# Patient Record
Sex: Female | Born: 1985 | Race: White | Hispanic: No | Marital: Single | Smoking: Current some day smoker
Health system: Southern US, Community
[De-identification: ages and names within clinical notes are randomized; demographics above are authoritative.]

## PROBLEM LIST (undated history)

## (undated) DIAGNOSIS — IMO0001 Reserved for inherently not codable concepts without codable children: Secondary | ICD-10-CM

## (undated) DIAGNOSIS — F329 Major depressive disorder, single episode, unspecified: Secondary | ICD-10-CM

## (undated) DIAGNOSIS — F1910 Other psychoactive substance abuse, uncomplicated: Secondary | ICD-10-CM

## (undated) DIAGNOSIS — F32A Depression, unspecified: Secondary | ICD-10-CM

## (undated) DIAGNOSIS — F1020 Alcohol dependence, uncomplicated: Secondary | ICD-10-CM

## (undated) HISTORY — DX: Depression, unspecified: F32.A

## (undated) HISTORY — DX: Major depressive disorder, single episode, unspecified: F32.9

---

## 2006-11-14 ENCOUNTER — Ambulatory Visit: Payer: Self-pay

## 2006-11-21 ENCOUNTER — Ambulatory Visit: Payer: Self-pay

## 2006-11-28 ENCOUNTER — Ambulatory Visit: Payer: Self-pay

## 2006-12-06 ENCOUNTER — Ambulatory Visit: Payer: Self-pay

## 2006-12-19 ENCOUNTER — Ambulatory Visit: Payer: Self-pay

## 2007-08-22 ENCOUNTER — Ambulatory Visit: Payer: Self-pay

## 2009-01-18 ENCOUNTER — Emergency Department (HOSPITAL_COMMUNITY): Admission: EM | Admit: 2009-01-18 | Discharge: 2009-01-19 | Payer: Self-pay | Admitting: Emergency Medicine

## 2009-09-17 ENCOUNTER — Ambulatory Visit: Payer: Self-pay | Admitting: Oncology

## 2009-09-28 LAB — CBC & DIFF AND RETIC
BASO%: 1 % (ref 0.0–2.0)
HCT: 37.5 % (ref 34.8–46.6)
LYMPH%: 23 % (ref 14.0–49.7)
MCHC: 34.4 g/dL (ref 31.5–36.0)
MCV: 103.3 fL — ABNORMAL HIGH (ref 79.5–101.0)
MONO#: 0.4 10*3/uL (ref 0.1–0.9)
MONO%: 12.1 % (ref 0.0–14.0)
NEUT%: 60.6 % (ref 38.4–76.8)
Platelets: 130 10*3/uL — ABNORMAL LOW (ref 145–400)
WBC: 3.1 10*3/uL — ABNORMAL LOW (ref 3.9–10.3)

## 2009-09-28 LAB — MORPHOLOGY: PLT EST: DECREASED

## 2009-10-01 LAB — LACTATE DEHYDROGENASE: LDH: 212 U/L (ref 94–250)

## 2009-10-01 LAB — COMPREHENSIVE METABOLIC PANEL
Albumin: 5 g/dL (ref 3.5–5.2)
BUN: 7 mg/dL (ref 6–23)
CO2: 24 mEq/L (ref 19–32)
Glucose, Bld: 94 mg/dL (ref 70–99)
Potassium: 4.2 mEq/L (ref 3.5–5.3)
Sodium: 136 mEq/L (ref 135–145)
Total Bilirubin: 0.9 mg/dL (ref 0.3–1.2)
Total Protein: 7.5 g/dL (ref 6.0–8.3)

## 2009-10-01 LAB — GAMMA GT: GGT: 196 U/L — ABNORMAL HIGH (ref 7–51)

## 2009-10-28 ENCOUNTER — Ambulatory Visit: Payer: Self-pay | Admitting: Oncology

## 2010-03-11 ENCOUNTER — Emergency Department (HOSPITAL_COMMUNITY)
Admission: EM | Admit: 2010-03-11 | Discharge: 2010-03-12 | Disposition: A | Discharge status: Home / Self Care | Payer: BC Managed Care – PPO | Attending: Emergency Medicine | Admitting: Emergency Medicine

## 2010-03-11 DIAGNOSIS — D72810 Lymphocytopenia: Secondary | ICD-10-CM | POA: Insufficient documentation

## 2010-03-11 DIAGNOSIS — F10939 Alcohol use, unspecified with withdrawal, unspecified: Secondary | ICD-10-CM | POA: Insufficient documentation

## 2010-03-11 DIAGNOSIS — R000 Tachycardia, unspecified: Secondary | ICD-10-CM | POA: Insufficient documentation

## 2010-03-11 DIAGNOSIS — F10239 Alcohol dependence with withdrawal, unspecified: Secondary | ICD-10-CM | POA: Insufficient documentation

## 2010-03-11 DIAGNOSIS — D696 Thrombocytopenia, unspecified: Secondary | ICD-10-CM | POA: Insufficient documentation

## 2010-03-11 DIAGNOSIS — F3289 Other specified depressive episodes: Secondary | ICD-10-CM | POA: Insufficient documentation

## 2010-03-11 DIAGNOSIS — F411 Generalized anxiety disorder: Secondary | ICD-10-CM | POA: Insufficient documentation

## 2010-03-11 DIAGNOSIS — F329 Major depressive disorder, single episode, unspecified: Secondary | ICD-10-CM | POA: Insufficient documentation

## 2010-03-12 LAB — RAPID URINE DRUG SCREEN, HOSP PERFORMED
Amphetamines: NOT DETECTED
Barbiturates: NOT DETECTED
Benzodiazepines: NOT DETECTED
Cocaine: NOT DETECTED

## 2010-03-12 LAB — DIFFERENTIAL
Basophils Relative: 1 % (ref 0–1)
Lymphocytes Relative: 9 % — ABNORMAL LOW (ref 12–46)
Monocytes Absolute: 0.1 10*3/uL (ref 0.1–1.0)
Monocytes Relative: 4 % (ref 3–12)
Neutro Abs: 2.3 10*3/uL (ref 1.7–7.7)
Neutrophils Relative %: 86 % — ABNORMAL HIGH (ref 43–77)

## 2010-03-12 LAB — CBC
HCT: 37.8 % (ref 36.0–46.0)
Hemoglobin: 12.6 g/dL (ref 12.0–15.0)
MCH: 35.2 pg — ABNORMAL HIGH (ref 26.0–34.0)
RBC: 3.58 MIL/uL — ABNORMAL LOW (ref 3.87–5.11)

## 2010-03-12 LAB — POCT I-STAT, CHEM 8
Creatinine, Ser: 0.9 mg/dL (ref 0.4–1.2)
Glucose, Bld: 78 mg/dL (ref 70–99)
Hemoglobin: 13.6 g/dL (ref 12.0–15.0)
TCO2: 11 mmol/L (ref 0–100)

## 2010-03-12 LAB — ETHANOL: Alcohol, Ethyl (B): 231 mg/dL — ABNORMAL HIGH (ref 0–10)

## 2010-03-22 LAB — CBC
MCHC: 34.3 g/dL (ref 30.0–36.0)
MCV: 101.3 fL — ABNORMAL HIGH (ref 78.0–100.0)
Platelets: 111 10*3/uL — ABNORMAL LOW (ref 150–400)
RDW: 13.1 % (ref 11.5–15.5)

## 2010-03-22 LAB — BASIC METABOLIC PANEL
BUN: 9 mg/dL (ref 6–23)
CO2: 25 mEq/L (ref 19–32)
Calcium: 9.5 mg/dL (ref 8.4–10.5)
Chloride: 97 mEq/L (ref 96–112)
Creatinine, Ser: 0.74 mg/dL (ref 0.4–1.2)

## 2010-03-22 LAB — DIFFERENTIAL
Basophils Absolute: 0 10*3/uL (ref 0.0–0.1)
Basophils Relative: 1 % (ref 0–1)
Eosinophils Absolute: 0 10*3/uL (ref 0.0–0.7)
Neutro Abs: 4.4 10*3/uL (ref 1.7–7.7)
Neutrophils Relative %: 85 % — ABNORMAL HIGH (ref 43–77)

## 2010-03-22 LAB — LIPASE, BLOOD: Lipase: 31 U/L (ref 11–59)

## 2011-02-04 ENCOUNTER — Ambulatory Visit (INDEPENDENT_AMBULATORY_CARE_PROVIDER_SITE_OTHER): Payer: BC Managed Care – PPO | Admitting: Internal Medicine

## 2011-02-04 VITALS — BP 123/74 | HR 73 | Temp 99.4°F | Resp 16 | Ht 65.5 in | Wt 118.0 lb

## 2011-02-04 DIAGNOSIS — F909 Attention-deficit hyperactivity disorder, unspecified type: Secondary | ICD-10-CM

## 2011-02-04 DIAGNOSIS — F411 Generalized anxiety disorder: Secondary | ICD-10-CM

## 2011-02-04 DIAGNOSIS — F1910 Other psychoactive substance abuse, uncomplicated: Secondary | ICD-10-CM

## 2011-02-04 DIAGNOSIS — F1010 Alcohol abuse, uncomplicated: Secondary | ICD-10-CM | POA: Insufficient documentation

## 2011-02-04 DIAGNOSIS — G4700 Insomnia, unspecified: Secondary | ICD-10-CM | POA: Insufficient documentation

## 2011-02-04 NOTE — Progress Notes (Signed)
Joua returns after her her last 3 months on Wellbutrin. She has continued to be up and down with some alcohol use. She is still in the application process for graduate school at [employer]. Her main problem today is that she can't sleep. This is a long-standing problem dating to childhood in no medicine is found to be very effective. She is currently not in therapy being dissatisfied with everything that happened.  Review of systems is unchanged  Physical examination is not necessary for this visit  Her neuropsychiatric  status is stable.   Problem #1 generalized anxiety disorder  Problem #2 ADD-off medication  Problem #3 PTSD  Problem #4 insomnia  Problem #5 depression  Problem #6  Alcohol abuse  Plan-1 continued Wellbutrin6 at 300 XL  2 add Halcion 0.25 at bedtime as a trial. If not effective she will try 25-50 mg and Seroquel. She will call with the results for a prescription.

## 2011-03-01 ENCOUNTER — Ambulatory Visit (INDEPENDENT_AMBULATORY_CARE_PROVIDER_SITE_OTHER): Payer: BC Managed Care – PPO | Admitting: Internal Medicine

## 2011-03-01 DIAGNOSIS — G4700 Insomnia, unspecified: Secondary | ICD-10-CM

## 2011-03-01 DIAGNOSIS — F1910 Other psychoactive substance abuse, uncomplicated: Secondary | ICD-10-CM

## 2011-03-01 DIAGNOSIS — F411 Generalized anxiety disorder: Secondary | ICD-10-CM

## 2011-03-01 MED ORDER — TRAZODONE HCL 100 MG PO TABS: 100.0000 mg | ORAL_TABLET | Freq: Every day | ORAL | Status: DC

## 2011-03-01 NOTE — Progress Notes (Signed)
  Subjective:    Patient ID: Alexandra Osborne, female    DOB: 1985/03/03, 26 y.o.   MRN: [id number]  HPIFollowup for problems with anxiety and insomnia and the related alcohol abuse. She continues with sundown since has decided to commit to a treatment protocol with vivitrol which will be directed by Dr. Delano Metz. Anxiety continues to be fairly prevalent but low level at this point. She continues on Wellbutrin She has had increased problems with insomnia. Note that this has been a problem for many years. Her last successful medication was trazodone at 100 mg and she would like to try that again.  She has been accepted into the Spanish-language graduate program at [employer] and given a two thirds scholarship. 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 She continues to be reminded of problem behaviors and the consequences that others had to suffer. She is having a hard for getting herself for this behavior and a hard time getting past herself    Review of SystemsNoncontributory     Objective:   Physical Exam   Vital signs stable Neurological intact      Assessment & Plan:  Problem #1 insomnia Trazodone 100 mg #30 with 5 refills  Problem #2 alcohol abuse I discussed with Dr. Salvadore Farber arranging for her to [facility] [place] office to start vivitrol which she has arranged with her insurance company  Problem #3 depression and anxiety/generalized anxiety disorder /Continue Wellbutrin she may call for refills  Problem #4 ADD-when she starts school in the fall we will restart her Adderall

## 2011-04-02 ENCOUNTER — Emergency Department (HOSPITAL_COMMUNITY): Payer: BC Managed Care – PPO

## 2011-04-02 ENCOUNTER — Encounter (HOSPITAL_COMMUNITY): Payer: Self-pay | Admitting: Emergency Medicine

## 2011-04-02 ENCOUNTER — Emergency Department (HOSPITAL_COMMUNITY)
Admission: EM | Admit: 2011-04-02 | Discharge: 2011-04-03 | Disposition: A | Discharge status: Home / Self Care | Payer: BC Managed Care – PPO | Attending: Emergency Medicine | Admitting: Emergency Medicine

## 2011-04-02 DIAGNOSIS — S0180XA Unspecified open wound of other part of head, initial encounter: Secondary | ICD-10-CM | POA: Insufficient documentation

## 2011-04-02 DIAGNOSIS — F1010 Alcohol abuse, uncomplicated: Secondary | ICD-10-CM | POA: Insufficient documentation

## 2011-04-02 DIAGNOSIS — S0181XA Laceration without foreign body of other part of head, initial encounter: Secondary | ICD-10-CM

## 2011-04-02 DIAGNOSIS — F10929 Alcohol use, unspecified with intoxication, unspecified: Secondary | ICD-10-CM

## 2011-04-02 DIAGNOSIS — S02609A Fracture of mandible, unspecified, initial encounter for closed fracture: Secondary | ICD-10-CM

## 2011-04-02 DIAGNOSIS — W010XXA Fall on same level from slipping, tripping and stumbling without subsequent striking against object, initial encounter: Secondary | ICD-10-CM | POA: Insufficient documentation

## 2011-04-02 MED ORDER — LIDOCAINE-EPINEPHRINE 2 %-1:100000 IJ SOLN
20.0000 mL | Freq: Once | INTRAMUSCULAR | Status: AC
  Administered 2011-04-03: 20 mL via INTRADERMAL

## 2011-04-02 NOTE — ED Notes (Signed)
Patient transported to CT 

## 2011-04-02 NOTE — ED Notes (Addendum)
Pt fell and hit chin, pt does not remember incident d/t alcohol intoxication.  Approx 1" laceration noted under chin.  Bleeding controlled at this time, last Tetanus this year.  Pt also c/o pain to left side of face, no injuries noted, paint noted on left ear, pt unsure of how it got there.

## 2011-04-02 NOTE — ED Provider Notes (Signed)
History     CSN: [id number]  Arrival date & time 04/02/11  2057   First MD Initiated Contact with Patient 04/02/11 2141      Chief Complaint  Patient presents with  . Facial Laceration    (Consider location/radiation/quality/duration/timing/severity/associated sxs/prior treatment) HPI  26 year old female with a significant history of alcohol abuse presents with chief complaints of face laceration. She was obtained through patient and her family members.  Per pt, she admits to drinking alcohol today.  Sts she stumbled and fell forward hitting her face against a brick wall.  she denies loss of consciousness. She noticed pain to her chin and jaw worsening with talking. She denies neck pain, chest pain or shortness of breath, abdominal pain. She denies any other injuries. She is currently taking Viviltrol to help ease alcohol addiction.  She is receiving monthly shots but has missed her schedule shot for about a week.  Pt denies SI/HI, auditory or visual hallucination.  Denies any other rec drug use.  Tetanus is UTD.    Per parent, pt has been abusing alcohol for the past 5 years.  Alcohol abuse likely related to relationship stress. Patient has been to [facility] in the past. She is currently not attending any rehabilitation with exception of using Vivitrol.  Her PCP is aware of her alcohol addiction.    History reviewed. No pertinent past medical history.  History reviewed. No pertinent past surgical history.  History reviewed. No pertinent family history.  History  Substance Use Topics  . Smoking status: Current Some Day Smoker    Types: Cigarettes  . Smokeless tobacco: Not on file  . Alcohol Use: Yes    OB History    Grav Para Term Preterm Abortions TAB SAB Ect Mult Living                  Review of Systems  All other systems reviewed and are negative.    Allergies  Review of patient's allergies indicates no known allergies.  Home Medications   Current  Outpatient Rx  Name Route Sig Dispense Refill  . BUPROPION HCL ER (XL) 300 MG PO TB24 Oral Take 300 mg by mouth daily.    Kathrynn Running ESTRAD TRIPHASIC 0.5/0.75/1-35 MG-MCG PO TABS Oral Take 1 tablet by mouth daily.    . TRAZODONE HCL 100 MG PO TABS Oral Take 1 tablet (100 mg total) by mouth at bedtime. 30 tablet 2    BP 121/81  Pulse 98  Temp(Src) 97.8 F (36.6 C) (Oral)  Resp 18  SpO2 95%  LMP 03/21/2011  Physical Exam  Nursing note and vitals reviewed. Constitutional: She appears well-developed and well-nourished.       Awake, alert, nontoxic appearance.  Appears tearful, and slurring of words  HENT:  Head: Normocephalic.       2 cm horizontal laceration is of chin inferiorly. Mild pressure noted across bridge of nose. Trismus noted. no obvious dental pain. No hemotympanum. No septal hematoma. No significant midface tenderness.  Eyes: Conjunctivae are normal. Right eye exhibits no discharge. Left eye exhibits no discharge.  Neck: Neck supple.  Cardiovascular: Normal rate and regular rhythm.   Pulmonary/Chest: Effort normal. No respiratory distress. She exhibits no tenderness.  Abdominal: Soft. There is no tenderness. There is no rebound.  Musculoskeletal: Normal range of motion. She exhibits no tenderness.       ROM appears intact, no obvious focal weakness  Neurological:       Unsteady gait, poor coordination. Romberg  negative  Skin: No rash noted.  Psychiatric:       Appears intoxicated    ED Course  Procedures (including critical care time)  Labs Reviewed - No data to display No results found.   No diagnosis found.  LACERATION REPAIR Performed by: Fayrene Helper Authorized byFayrene Helper Consent: Verbal consent obtained. Risks and benefits: risks, benefits and alternatives were discussed Consent given by: patient Patient identity confirmed: provided demographic data Prepped and Draped in normal sterile fashion Wound explored  Laceration Location: inferior  aspect of chin  Laceration Length: 2cm  No Foreign Bodies seen or palpated  Anesthesia: local infiltration  Local anesthetic: lidocaine 2% w epinephrine  Anesthetic total: 3 ml  Irrigation method: syringe Amount of cleaning: standard  Skin closure: prolene 5.0  Number of sutures: 4  Technique: simple interrupted  Patient tolerance: Patient tolerated the procedure well with no immediate complications.  Results for orders placed during the hospital encounter of 03/11/10  POCT I-STAT, CHEM 8      Component Value Range   Sodium 136  135 - 145 (mEq/L)   Potassium 4.4  3.5 - 5.1 (mEq/L)   Chloride 107  96 - 112 (mEq/L)   BUN 6  6 - 23 (mg/dL)   Creatinine, Ser 0.9  0.4 - 1.2 (mg/dL)   Glucose, Bld 78  70 - 99 (mg/dL)   Calcium, Ion 1.61 (*) 1.12 - 1.32 (mmol/L)   TCO2 11  0 - 100 (mmol/L)   Hemoglobin 13.6  12.0 - 15.0 (g/dL)   HCT 09.6  04.5 - 40.9 (%)  DIFFERENTIAL      Component Value Range   Neutrophils Relative 86 (*) 43 - 77 (%)   Neutro Abs 2.3  1.7 - 7.7 (K/uL)   Lymphocytes Relative 9 (*) 12 - 46 (%)   Lymphs Abs 0.2 (*) 0.7 - 4.0 (K/uL)   Monocytes Relative 4  3 - 12 (%)   Monocytes Absolute 0.1  0.1 - 1.0 (K/uL)   Eosinophils Relative 0  0 - 5 (%)   Eosinophils Absolute 0.0  0.0 - 0.7 (K/uL)   Basophils Relative 1  0 - 1 (%)   Basophils Absolute 0.0  0.0 - 0.1 (K/uL)  CBC      Component Value Range   WBC 2.7 (*) 4.0 - 10.5 (K/uL)   RBC 3.58 (*) 3.87 - 5.11 (MIL/uL)   Hemoglobin 12.6  12.0 - 15.0 (g/dL)   HCT 81.1  91.4 - 78.2 (%)   MCV 105.6 (*) 78.0 - 100.0 (fL)   MCH 35.2 (*) 26.0 - 34.0 (pg)   MCHC 33.3  30.0 - 36.0 (g/dL)   RDW 95.6  21.3 - 08.6 (%)   Platelets 113 (*) 150 - 400 (K/uL)  ETHANOL      Component Value Range   Alcohol, Ethyl (B)   (*) 0 - 10 (mg/dL)   Value: 578            LOWEST DETECTABLE LIMIT FOR     SERUM ALCOHOL IS 5 mg/dL     FOR MEDICAL PURPOSES ONLY  DRUG SCREEN PANEL, EMERGENCY      Component Value Range   Opiates  NONE DETECTED  NONE DETECTED    Cocaine NONE DETECTED  NONE DETECTED    Benzodiazepines NONE DETECTED  NONE DETECTED    Amphetamines NONE DETECTED  NONE DETECTED    Tetrahydrocannabinol POSITIVE (*) NONE DETECTED    Barbiturates    NONE DETECTED    Value: NONE  DETECTED            DRUG SCREEN FOR MEDICAL PURPOSES     ONLY.  IF CONFIRMATION IS NEEDED     FOR ANY PURPOSE, NOTIFY LAB     WITHIN 5 DAYS.                LOWEST DETECTABLE LIMITS     FOR URINE DRUG SCREEN     Drug Class       Cutoff (ng/mL)     Amphetamine      1000     Barbiturate      200     Benzodiazepine   200     Tricyclics       300     Opiates          300     Cocaine          300     THC              50  PREGNANCY, URINE      Component Value Range   Preg Test, Ur       Value: NEGATIVE            THE SENSITIVITY OF THIS     METHODOLOGY IS >24 mIU/mL   Ct Maxillofacial Wo Cm  04/02/2011  *RADIOLOGY REPORT*  Clinical Data: Fall, hit chin.  Laceration.  CT MAXILLOFACIAL WITHOUT CONTRAST  Technique:  Multidetector CT imaging of the maxillofacial structures was performed. Multiplanar CT image reconstructions were also generated.  Comparison: None.  Findings: The study is severely limited by patient motion, despite repeating the study.  There is a fracture through the left mandibular head/neck.  It is difficult to exclude fracture elsewhere in the mandible due to extensive patient motion.  No other visible displaced fracture.  Paranasal sinuses are clear. Orbital soft tissues are unremarkable.  IMPRESSION: Fracture through the left mandibular head/neck region.  Mild displacement of the mandibular head.  The remainder of the mandible is difficult to completely clear due to extensive patient motion despite repeating the study.  Original Report Authenticated By: Cyndie Chime, M.D.      MDM  Patient has a significant history of alcohol abuse. Last alcohol use was today. Unable to quantify an amount. Had apparent fall with  a laceration to her chin. No other obvious injury. Maxillofacial CT order to rule out fractures. Plan to perform wound care to her laceration.  Patient's parent request for rehabs. However, patient is not willing to participate today. She denies homicidal or suicidal ideation. Parent appears to be caring.  12:17 AM Successful sutures to chin laceration. This maxillofacial CT shows evidence of a left mandibular head fracture. Image was poorly performed due to patient's moving. However no other obvious fractures noted. I discussed with my attending who has consult with Dr. Lazarus Salines, who agrees to see pt in office next week for further evaluation.  Recommend soft diet and pain medication.  Pt also instruct to have suture removed in 7 days.  Alcohol cessation discussed.  Resource given.  Parent will take pt home and ensure safety.        Fayrene Helper, PA-C 04/03/11 [phone]

## 2011-04-03 MED ORDER — HYDROCODONE-ACETAMINOPHEN 5-500 MG PO TABS: 1.0000 | ORAL_TABLET | Freq: Four times a day (QID) | ORAL | Status: AC | PRN

## 2011-04-03 NOTE — Discharge Instructions (Signed)
You have a left mandibular head fracture.  Please call Dr. Lazarus Salines for follow up at your earliest convenient.  Your chin laceration were sutured.  Suture will need to be removed in 5-7 days.  Please eat soft diet, take pain medication as directed.  Alcohol cessation can be achieve with help.  Use resources given.  Return if you have any other concerns.   Finding Treatment for Alcohol and Drug Addiction It can be hard to find the right place to get professional treatment. Here are some important things to consider:  There are different types of treatment to choose from.   Some programs are live-in (residential) while others are not (outpatient). Sometimes a combination is offered.   No single type of program is right for everyone.   Most treatment programs involve a combination of education, counseling, and a 12-step, spiritually-based approach.   There are non-spiritually based programs (not 12-step).   Some treatment programs are government sponsored. They are geared for patients without private insurance.   Treatment programs can vary in many respects such as:   Cost and types of insurance accepted.   Types of on-site medical services offered.   Length of stay, setting, and size.   Overall philosophy of treatment.  A person may need specialized treatment or have needs not addressed by all programs. For example, adolescents need treatment appropriate for their age. Other people have secondary disorders that must be managed as well. Secondary conditions can include mental illness, such as depression or diabetes. Often, a period of detoxification from alcohol or drugs is needed. This requires medical supervision and not all programs offer this. THINGS TO CONSIDER WHEN SELECTING A TREATMENT PROGRAM   Is the program certified by the appropriate government agency? Even private programs must be certified and employ certified professionals.   Does the program accept your insurance? If not,  can a payment plan be set up?   Is the facility clean, organized, and well run? Do they allow you to speak with graduates who can share their treatment experience with you? Can you tour the facility? Can you meet with staff?   Does the program meet the full range of individual needs?   Does the treatment program address sexual orientation and physical disabilities? Do they provide age, gender, and culturally appropriate treatment services?   Is treatment available in languages other than English?   Is long-term aftercare support or guidance encouraged and provided?   Is assessment of an individual's treatment plan ongoing to ensure it meets changing needs?   Does the program use strategies to encourage reluctant patients to remain in treatment long enough to increase the likelihood of success?   Does the program offer counseling (individual or group) and other behavioral therapies?   Does the program offer medicine as part of the treatment regimen, if needed?   Is there ongoing monitoring of possible relapse? Is there a defined relapse prevention program? Are services or referrals offered to family members to ensure they understand addiction and the recovery process? This would help them support the recovering individual.   Are 12-step meetings held at the center or is transport available for patients to attend outside meetings?  In countries outside of the [place]. and [place], Magazine features editor for contact information for services in your area. Document Released: 11/18/2004 Document Revised: 12/09/2010 Document Reviewed: 05/31/2007 [facility] Patient Information 2012 [place], [place].Facial Fracture A facial fracture is a break in one of the bones of your face. HOME CARE INSTRUCTIONS  Protect the injured part of your face until it is healed.   Do not participate in activities which give chance for re-injury until your doctor approves.   Gently wash and dry your face.   Wear head and  facial protection while riding a bicycle, motorcycle, or snowmobile.  SEEK MEDICAL CARE IF:   An oral temperature above 102 F (38.9 C) develops.   You have severe headaches or notice changes in your vision.   You have new numbness or tingling in your face.   You develop nausea (feeling sick to your stomach), vomiting or a stiff neck.  SEEK IMMEDIATE MEDICAL CARE IF:   You develop difficulty seeing or experience double vision.   You become dizzy, lightheaded, or faint.   You develop trouble speaking, breathing, or swallowing.   You have a watery discharge from your nose or ear.  MAKE SURE YOU:   Understand these instructions.   Will watch your condition.   Will get help right away if you are not doing well or get worse.  Document Released: 12/20/2004 Document Revised: 12/09/2010 Document Reviewed: 08/09/2007 [facility] Patient Information 2012 [place], [place].Facial Laceration A facial laceration is a cut on the face. It can take 1 to 2 years for the scar to heal completely. HOME CARE  For stitches (sutures):  Keep the cut clean and dry.   If you have a bandage (dressing), change it at least once a day. Change the bandage if it gets wet or dirty, or as told by your doctor.   Wash the cut with soap and water 2 times a day. Rinse the cut with water. Pat it dry with a clean towel.   Put a thin layer of medicated cream on the cut as told by your doctor.   You may shower after the first 24 hours. Do not soak the cut in water until the stitches are removed.   Only take medicines as told by your doctor.   Have your stitches removed as told by your doctor.   Do not wear makeup until a few days after your stitches are removed.  For skin adhesive strips:  Keep the cut clean and dry.   Do not get the strips wet. You may take a bath, but be careful to keep the cut dry.   If the cut gets wet, pat it dry with a clean towel.   The strips will fall off on their own. Do not  remove the strips that are still stuck to the cut.  For wound glue:  You may shower or take baths. Do not soak or scrub the cut. Do not swim. Avoid heavy sweating until the glue falls off on its own. After a shower or bath, pat the cut dry with a clean towel.   Do not put medicine or makeup on your cut until the glue falls off.   If you have a bandage, do not put tape over the glue.   Avoid lots of sunlight or tanning lamps until the glue falls off. Put sunscreen on the cut for the first year to reduce your scar.   The glue will fall off on its own. Do not pick at the glue.  You may need a tetanus shot if:  You cannot remember when you had your last tetanus shot.   You have never had a tetanus shot.  If you need a tetanus shot and you choose not to have one, you may get tetanus. Sickness from tetanus can  be serious. GET HELP RIGHT AWAY IF:   Your cut gets red, painful, or puffy (swollen).   There is yellowish-white fluid (pus) coming from the cut.   You have chills or a fever.  MAKE SURE YOU:   Understand these instructions.   Will watch your condition.   Will get help right away if you are not doing well or get worse.  Document Released: 06/08/2007 Document Revised: 12/09/2010 Document Reviewed: 06/15/2010 [facility] Patient Information 2012 [place], [place].   RESOURCE GUIDE  Dental Problems  Patients with Medicaid: [facility]                     [phone] [address]                                           Phone:  [phone]                                                  If unable to pay or uninsured, contact:  [facility] or [facility]. to become qualified for the adult dental clinic.  Chronic Pain Problems Contact [facility]  [phone] Patients need to be referred by their primary care doctor.  Insufficient Money for Medicine Contact United Way:  call "211" or [facility] (916)089-8859.  No Primary  Care Doctor Call [facility]  548-654-1915 Other agencies that provide inexpensive medical care    [facility]  7693469713    [facility]  [phone]    [facility]  [phone]    [facility]  [phone]    [facility]  [phone]    [facility]  [phone]  Substance Abuse Resources [facility]  646-774-1287 [facility] 201-491-6882 [facility] [place] (626)646-4351 Floydene Flock 872-735-2887 Residential & Outpatient Substance Abuse Program  ([phone]  Psychological Services [facility]  ([phone] [facility]  [phone] [facility]   [phone] (emergency services 450-688-0250)  Abuse/Neglect [facility] Child Abuse Hotline 680 604 4518 [facility] Child Abuse Hotline (808)494-7162 (After Hours)  Emergency Shelter [facility] Ministries 639-329-7092  [facility] [place] [facility] [phone] [facility] Alert Services (715)[phone]  MRSA Hotline #:   214-221-5299    [facility] Resources  [facility]  United Way                           [facility] Dept. [address] [place]                     [address]         [address]  Patrecia Pace  [facility] Phone:  [phone]                                  Phone:  [phone]                   Phone:  [phone]  [facility] Phone:  [phone]  [facility] Child Abuse Hotline [phone] (After Hours)

## 2011-04-03 NOTE — ED Provider Notes (Signed)
Discussed with Dr Lazarus Salines.  The images are suboptimal however of the fracture depicted can be treated with a soft diet and pain medications. He would like to see the patient in followup in his office this week.  He plans on repeat images at that time.  Celene Kras, MD 04/03/11 [phone]

## 2011-08-24 ENCOUNTER — Other Ambulatory Visit: Payer: Self-pay | Admitting: Internal Medicine

## 2011-08-24 ENCOUNTER — Telehealth: Payer: Self-pay

## 2011-08-24 MED ORDER — AMPHETAMINE-DEXTROAMPHET ER 15 MG PO CP24: 15.0000 mg | ORAL_CAPSULE | ORAL | Status: DC

## 2011-08-24 MED ORDER — TRAZODONE HCL 100 MG PO TABS: 100.0000 mg | ORAL_TABLET | Freq: Every day | ORAL | Status: DC

## 2011-08-24 NOTE — Telephone Encounter (Signed)
Done and printed

## 2011-08-24 NOTE — Telephone Encounter (Signed)
OK X 1, NEEDS OV FOR MORE? 

## 2011-08-24 NOTE — Telephone Encounter (Signed)
Patient advised is at front desk for pick up

## 2011-08-24 NOTE — Telephone Encounter (Signed)
Chart pulled MR [id number]

## 2011-08-24 NOTE — Telephone Encounter (Signed)
PT STATES SHE IS IN NEED OF HER ADDERALL AND TRAZODONE. HER MOM WILL COME AND PICK UP PLEASE CALL [phone] WHEN READY

## 2011-08-24 NOTE — Telephone Encounter (Signed)
Please pull paper chart. Need Adderall dose.

## 2011-09-29 ENCOUNTER — Telehealth: Payer: Self-pay

## 2011-09-29 NOTE — Telephone Encounter (Signed)
Patient was prescribed 15 mg adderall.   She takes 5 mg up to three times a day.  She needs to discuss other things with Dr. Merla Riches and would like an URGENT call to her.   [phone]

## 2011-09-29 NOTE — Telephone Encounter (Signed)
Let her know I'm out-of-town until Monday She is struggling in graduate school at [employer]-Needs all the help she can get

## 2011-09-29 NOTE — Telephone Encounter (Signed)
Patient states her med should be 5 mg tid, however reviewing her chart she has not been given the 5mg  tid dose since 2011. She has been taking 15 mg XR. She wants you to advise. Chart in your box . Alexandra Osborne

## 2011-09-29 NOTE — Telephone Encounter (Signed)
2 continue that last message 5 mg 3 times a day when necessary we give her the greatest flexibility for her studies and that would be okay for me #90 no refills and we can talk again when needed

## 2011-09-30 ENCOUNTER — Other Ambulatory Visit: Payer: Self-pay | Admitting: Physician Assistant

## 2011-09-30 MED ORDER — AMPHETAMINE-DEXTROAMPHET ER 5 MG PO CP24: 5.0000 mg | ORAL_CAPSULE | Freq: Three times a day (TID) | ORAL | Status: DC

## 2011-09-30 MED ORDER — AMPHETAMINE-DEXTROAMPHETAMINE 5 MG PO TABS: 5.0000 mg | ORAL_TABLET | Freq: Three times a day (TID) | ORAL | Status: DC

## 2011-09-30 NOTE — Telephone Encounter (Signed)
Tried to call. Still no ans/VM full.

## 2011-09-30 NOTE — Telephone Encounter (Signed)
Rx printed. Called patient to advise but her voice mail box is full, if she calls back, will advise her Rx is at front desk for pick up

## 2011-10-03 NOTE — Telephone Encounter (Signed)
Notified pt that Adderall Rx is ready for p/up. Pt asked if Dr Merla Riches could Rx for Xanax for her because she has been having panic attacks. States they started about a month ago and she probably has them about once daily. Pt reports that she went to the hosp w/one recently. Pt states that Dr Merla Riches used to Rx xanax for pt's anxiety and she requests that he send in a new Rx to [facility] if possibly. Pt's paper chart pulled and put in Dr Doolittle's box [id number].

## 2011-10-04 MED ORDER — ALPRAZOLAM 0.5 MG PO TABS: ORAL_TABLET | ORAL | Status: DC

## 2011-10-04 NOTE — Telephone Encounter (Signed)
I sent in the Rx to [facility] with a note for them to have patient call me back so I can speak to her, the mailbox for her VM is full

## 2011-10-04 NOTE — Telephone Encounter (Signed)
Discussed case with mother/Alexandra Osborne is now at [employer] in graduate school in Spanish/struggling with the curriculum/his head only very occasional relapses with alcoholism since her treatment as inpatient this summer She is pursuing counseling at the [facility] Her mailboxes full We will allow a small amount of medication that she needs to followup in the next 2 or 3 weeks probably on the weekend Meds ordered this encounter  Medications                 . ALPRAZolam (XANAX) 0.5 MG tablet    Sig: One as needed for panic    Dispense:  10 tablet    Refill:  0

## 2011-10-05 ENCOUNTER — Telehealth: Payer: Self-pay

## 2011-10-05 NOTE — Telephone Encounter (Signed)
Pt is requesting xanax   cbn [phone]

## 2011-10-05 NOTE — Telephone Encounter (Signed)
Pt notified that rx was at pharmacy 

## 2011-10-10 NOTE — Telephone Encounter (Signed)
I am still unable to reach patient, her VM is full, I have tried several times. I have had the pharmacy advise her to call so I can advise she needs appt with Dr Merla Riches.

## 2011-10-11 NOTE — Progress Notes (Signed)
Received an approval for prior auth for pt's Adderall 5 mg TID. Faxed approval notice to pharmacy.

## 2011-10-11 NOTE — Telephone Encounter (Signed)
Patient does not return my calls. FYI, multiple attempts.

## 2011-10-25 NOTE — Telephone Encounter (Signed)
Needs appt 10/23 9:15am 104

## 2011-10-25 NOTE — Telephone Encounter (Signed)
Appt made, pt mother notified by Dr. Merla Riches. Alexandra Osborne

## 2011-10-26 ENCOUNTER — Ambulatory Visit: Payer: BC Managed Care – PPO | Admitting: Internal Medicine

## 2011-10-31 ENCOUNTER — Encounter (HOSPITAL_COMMUNITY): Payer: Self-pay | Admitting: Emergency Medicine

## 2011-10-31 ENCOUNTER — Emergency Department (HOSPITAL_COMMUNITY)
Admission: EM | Admit: 2011-10-31 | Discharge: 2011-11-01 | Disposition: A | Discharge status: Home / Self Care | Payer: BC Managed Care – PPO | Attending: Emergency Medicine | Admitting: Emergency Medicine

## 2011-10-31 ENCOUNTER — Emergency Department (HOSPITAL_COMMUNITY): Payer: BC Managed Care – PPO

## 2011-10-31 ENCOUNTER — Ambulatory Visit: Payer: BC Managed Care – PPO | Admitting: Internal Medicine

## 2011-10-31 VITALS — BP 120/70 | HR 111 | Temp 98.0°F | Resp 16 | Ht 66.0 in | Wt 107.0 lb

## 2011-10-31 DIAGNOSIS — R45851 Suicidal ideations: Secondary | ICD-10-CM

## 2011-10-31 DIAGNOSIS — F329 Major depressive disorder, single episode, unspecified: Secondary | ICD-10-CM | POA: Insufficient documentation

## 2011-10-31 DIAGNOSIS — D696 Thrombocytopenia, unspecified: Secondary | ICD-10-CM

## 2011-10-31 DIAGNOSIS — F1010 Alcohol abuse, uncomplicated: Secondary | ICD-10-CM | POA: Insufficient documentation

## 2011-10-31 DIAGNOSIS — R112 Nausea with vomiting, unspecified: Secondary | ICD-10-CM | POA: Insufficient documentation

## 2011-10-31 DIAGNOSIS — F10239 Alcohol dependence with withdrawal, unspecified: Secondary | ICD-10-CM

## 2011-10-31 DIAGNOSIS — F3289 Other specified depressive episodes: Secondary | ICD-10-CM | POA: Insufficient documentation

## 2011-10-31 DIAGNOSIS — F17200 Nicotine dependence, unspecified, uncomplicated: Secondary | ICD-10-CM | POA: Insufficient documentation

## 2011-10-31 DIAGNOSIS — S0990XA Unspecified injury of head, initial encounter: Secondary | ICD-10-CM

## 2011-10-31 LAB — POCT CBC
HCT, POC: 42 % (ref 37.7–47.9)
Hemoglobin: 13.2 g/dL (ref 12.2–16.2)
MCH, POC: 33 pg — AB (ref 27–31.2)
MCV: 105.1 fL — AB (ref 80–97)
MPV: 9 fL (ref 0–99.8)
POC MID %: 7.8 %M (ref 0–12)
RBC: 4 M/uL — AB (ref 4.04–5.48)
WBC: 5.1 10*3/uL (ref 4.6–10.2)

## 2011-10-31 LAB — COMPREHENSIVE METABOLIC PANEL
ALT: 44 U/L — ABNORMAL HIGH (ref 0–35)
Albumin: 4 g/dL (ref 3.5–5.2)
Alkaline Phosphatase: 92 U/L (ref 39–117)
Glucose, Bld: 81 mg/dL (ref 70–99)
Potassium: 3.6 mEq/L (ref 3.5–5.1)
Sodium: 137 mEq/L (ref 135–145)
Total Protein: 6.5 g/dL (ref 6.0–8.3)

## 2011-10-31 LAB — URINALYSIS, ROUTINE W REFLEX MICROSCOPIC
Bilirubin Urine: NEGATIVE
Nitrite: NEGATIVE
Protein, ur: NEGATIVE mg/dL
Urobilinogen, UA: 1 mg/dL (ref 0.0–1.0)

## 2011-10-31 LAB — RAPID URINE DRUG SCREEN, HOSP PERFORMED
Amphetamines: NOT DETECTED
Barbiturates: NOT DETECTED
Benzodiazepines: NOT DETECTED

## 2011-10-31 LAB — CBC
Hemoglobin: 12.4 g/dL (ref 12.0–15.0)
MCHC: 35.8 g/dL (ref 30.0–36.0)
RDW: 13.4 % (ref 11.5–15.5)

## 2011-10-31 MED ORDER — IBUPROFEN 800 MG PO TABS
800.0000 mg | ORAL_TABLET | Freq: Once | ORAL | Status: AC
  Administered 2011-10-31: 800 mg via ORAL
  Filled 2011-10-31: qty 1

## 2011-10-31 MED ORDER — LORAZEPAM 2 MG/ML IJ SOLN
0.0000 mg | Freq: Four times a day (QID) | INTRAMUSCULAR | Status: DC
  Administered 2011-10-31: 1 mg via INTRAVENOUS
  Filled 2011-10-31: qty 1

## 2011-10-31 MED ORDER — LORAZEPAM 2 MG/ML IJ SOLN: 0.0000 mg | Freq: Two times a day (BID) | INTRAMUSCULAR | Status: DC

## 2011-10-31 MED ORDER — ADULT MULTIVITAMIN W/MINERALS CH
1.0000 | ORAL_TABLET | Freq: Every day | ORAL | Status: DC
  Administered 2011-10-31: 1 via ORAL
  Filled 2011-10-31: qty 1

## 2011-10-31 MED ORDER — THIAMINE HCL 100 MG/ML IJ SOLN: 100.0000 mg | Freq: Every day | INTRAMUSCULAR | Status: DC

## 2011-10-31 MED ORDER — FOLIC ACID 1 MG PO TABS
1.0000 mg | ORAL_TABLET | Freq: Every day | ORAL | Status: DC
  Administered 2011-10-31: 1 mg via ORAL
  Filled 2011-10-31: qty 1

## 2011-10-31 MED ORDER — SODIUM CHLORIDE 0.9 % IV BOLUS (SEPSIS)
1000.0000 mL | Freq: Once | INTRAVENOUS | Status: AC
  Administered 2011-10-31: 1000 mL via INTRAVENOUS

## 2011-10-31 MED ORDER — VITAMIN B-1 100 MG PO TABS
100.0000 mg | ORAL_TABLET | Freq: Every day | ORAL | Status: DC
  Administered 2011-10-31: 100 mg via ORAL
  Filled 2011-10-31: qty 1

## 2011-10-31 MED ORDER — ONDANSETRON HCL 4 MG/2ML IJ SOLN
4.0000 mg | Freq: Once | INTRAMUSCULAR | Status: AC
  Administered 2011-10-31: 4 mg via INTRAVENOUS
  Filled 2011-10-31: qty 2

## 2011-10-31 NOTE — ED Notes (Signed)
Pt states she is here to detox from alcohol  Pt states she fell last night and pt has a black eye on the right from the fall   Pt was seen by Dr Merla Riches then sent here  Pt received 2 liters of NS at the drs office and had a CBC there  Results in the chart  Pt has had nausea and vomiting since this am

## 2011-10-31 NOTE — ED Provider Notes (Signed)
History     CSN: [id number]  Arrival date & time 10/31/11  2002   First MD Initiated Contact with Patient 10/31/11 2027      Chief Complaint  Patient presents with  . detox     (Consider location/radiation/quality/duration/timing/severity/associated sxs/prior treatment) The history is provided by the patient.   patient is requesting detox off of alcohol. She's previously had to be treated inpatient. She was sober for 6 weeks. She drinks every day. She's had falls and doesn't know what happened. She has a black eye on the right side now. She will drink up to a fifth of liquor a day. She last drank last night. She's had some vomiting. No confusion. She's had some anxiety and depression. She was sent in by her primary care doctor's office. No fevers. She's had an occasional cough. No abdominal pain.  Past Medical History  Diagnosis Date  . Depression     History reviewed. No pertinent past surgical history.  Family History  Problem Relation Age of Onset  . Stroke Maternal Grandfather   . Cancer Maternal Grandmother   . Diabetes Maternal Grandmother   . Stroke Other   . Cancer Other   . Hypertension Other     History  Substance Use Topics  . Smoking status: Current Some Day Smoker    Types: Cigarettes  . Smokeless tobacco: Not on file  . Alcohol Use: 0.0 oz/week     substance (alcohol) abuse    OB History    Grav Para Term Preterm Abortions TAB SAB Ect Mult Living                  Review of Systems  Constitutional: Negative for activity change and appetite change.  HENT: Negative for neck stiffness.   Eyes: Negative for pain.  Respiratory: Negative for chest tightness and shortness of breath.   Cardiovascular: Negative for chest pain and leg swelling.  Gastrointestinal: Positive for nausea and vomiting. Negative for abdominal pain and diarrhea.  Genitourinary: Negative for flank pain.  Musculoskeletal: Negative for back pain.  Skin: Negative for rash.    Neurological: Negative for weakness, numbness and headaches.  Psychiatric/Behavioral: Positive for dysphoric mood. Negative for behavioral problems. The patient is nervous/anxious.     Allergies  Review of patient's allergies indicates no known allergies.  Home Medications   Current Outpatient Rx  Name Route Sig Dispense Refill  . ALPRAZOLAM 0.5 MG PO TABS Oral Take 0.5 mg by mouth 3 (three) times daily as needed. One as needed for panic    . AMPHETAMINE-DEXTROAMPHETAMINE 5 MG PO TABS Oral Take 5 mg by mouth 3 (three) times daily.    . BUPROPION HCL ER (XL) 300 MG PO TB24 Oral Take 300 mg by mouth daily.    Kathrynn Running ESTRAD TRIPHASIC 0.5/0.75/1-35 MG-MCG PO TABS Oral Take 1 tablet by mouth daily.    . TRAZODONE HCL 100 MG PO TABS Oral Take 1 tablet (100 mg total) by mouth at bedtime. 30 tablet 0    BP 117/80  Pulse 96  Temp 99.5 F (37.5 C) (Oral)  Resp 22  SpO2 96%  LMP 10/28/2011  Physical Exam  Nursing note and vitals reviewed. Constitutional: She is oriented to person, place, and time. She appears well-developed and well-nourished.  HENT:  Head: Normocephalic.       Foul-smelling breath. Ecchymosis to right eyelid.  Eyes: EOM are normal. Pupils are equal, round, and reactive to light.  Neck: Normal range of motion. Neck supple.  Cardiovascular: Regular rhythm and normal heart sounds.   No murmur heard.      Mild tachycardia  Pulmonary/Chest: Effort normal and breath sounds normal. No respiratory distress. She has no wheezes. She has no rales.  Abdominal: Soft. Bowel sounds are normal. She exhibits no distension. There is no tenderness. There is no rebound and no guarding.  Musculoskeletal: Normal range of motion.  Neurological: She is alert and oriented to person, place, and time. No cranial nerve deficit.  Skin: Skin is warm and dry.  Psychiatric: She has a normal mood and affect. Her speech is normal.    ED Course  Procedures (including critical care  time)  Labs Reviewed  CBC - Abnormal; Notable for the following:    RBC 3.54 (*)     HCT 34.6 (*)     MCH 35.0 (*)     Platelets 77 (*)     All other components within normal limits  COMPREHENSIVE METABOLIC PANEL - Abnormal; Notable for the following:    Chloride 94 (*)     BUN 5 (*)     Creatinine, Ser 0.47 (*)     Calcium 8.2 (*)     AST 96 (*)     ALT 44 (*)     All other components within normal limits  ETHANOL - Abnormal; Notable for the following:    Alcohol, Ethyl (B) 142 (*)     All other components within normal limits  URINE RAPID DRUG SCREEN (HOSP PERFORMED) - Abnormal; Notable for the following:    Tetrahydrocannabinol POSITIVE (*)     All other components within normal limits  URINALYSIS, ROUTINE W REFLEX MICROSCOPIC - Abnormal; Notable for the following:    Hgb urine dipstick LARGE (*)     Ketones, ur TRACE (*)     Leukocytes, UA SMALL (*)     All other components within normal limits  URINE MICROSCOPIC-ADD ON - Abnormal; Notable for the following:    Bacteria, UA FEW (*)     All other components within normal limits  POCT PREGNANCY, URINE  URINE CULTURE   Dg Chest 2 View  10/31/2011  *RADIOLOGY REPORT*  Clinical Data: Cough.  CHEST - 2 VIEW  Comparison: None.  Findings: The heart size is normal.  The lungs are clear.  The visualized soft tissues and bony thorax are unremarkable.  IMPRESSION: Negative chest.   Original Report Authenticated By: Jamesetta Orleans. MATTERN, M.D.    Ct Head Wo Contrast  10/31/2011  *RADIOLOGY REPORT*  Clinical Data: Head injury.  Trauma.  CT HEAD WITHOUT CONTRAST  Technique:  Contiguous axial images were obtained from the base of the skull through the vertex without contrast.  Comparison: CT of the face 04/02/2011.  Findings: The left mandibular condyle fracture has healed.  The TMJ appears subluxed anteriorly.  The paranasal sinuses and mastoid air cells are clear.  The osseous skull is otherwise intact.  Right periorbital soft tissue  swelling is present.  There is no underlying fracture.  The globe and orbit is intact.  No acute cortical infarct, hemorrhage, or mass lesion is present. The ventricles are normal size.  No significant extra-axial fluid collection is present.  IMPRESSION:  1.  Normal CT appearance of the brain. 2.  Right periorbital soft tissue swelling.  The right globe and orbit is intact. 3.  Healed left mandibular condyle fracture with persistent subluxation of the TMJ.   Original Report Authenticated By: Jamesetta Orleans. MATTERN, M.D.  1. Alcohol abuse       MDM  Patient requesting detox off alcohol. Drinks heavily. Some depression without suicidal thoughts. She does have a contusion to her right eye and a negative head CT. She appears to medically cleared and will be seen by ACT team.        Juliet Rude. Rubin Payor, MD 11/01/11 0001
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
## 2011-11-01 LAB — COMPREHENSIVE METABOLIC PANEL
AST: 102 U/L — ABNORMAL HIGH (ref 0–37)
Albumin: 4.2 g/dL (ref 3.5–5.2)
BUN: 6 mg/dL (ref 6–23)
CO2: 30 mEq/L (ref 19–32)
Calcium: 8.7 mg/dL (ref 8.4–10.5)
Chloride: 93 mEq/L — ABNORMAL LOW (ref 96–112)
Potassium: 3.7 mEq/L (ref 3.5–5.3)

## 2011-11-01 NOTE — ED Notes (Signed)
Pt wanted to leave AMA. Dr Norlene Campbell in to see patient> Act Staff member Terri notified and a list of outpatient resource organizations given concerning alcohol abuse. Patient signed E signature, IV sites removed.  Belongings returned to patient from locker. Patient escorted by nursing tech Katrina out to ED patient pick up and drop off area.

## 2011-11-01 NOTE — ED Provider Notes (Signed)
Patient now wishes to go home.  She reports she is feeling fine, and will seek outpatient treatment options.  Pt is here voluntarily. Will d/c home.  Olivia Mackie, MD 11/01/11 [phone]

## 2011-11-02 LAB — URINE CULTURE: Colony Count: >=100000

## 2011-11-03 NOTE — ED Notes (Signed)
+   Urine Chart sent to EDP office for review. 

## 2011-11-04 ENCOUNTER — Telehealth: Payer: Self-pay

## 2011-11-04 NOTE — Telephone Encounter (Signed)
PATIENT CALLED WANTING TO SPEAK TO DOOLITTLE ABOUT AN EMERGENCY. PATIENT WOULD NOT DISCLOSE WHAT IT WAS ABOUT. PATIENT NOTIFIED THAT DOOLITTLE WILL BE IN THIS WEEKEND AND TO ALLOW AT LEAST 24 HOURS FOR A PHONE CALL TO BE RETURNED BY A STAFF MEMBER. ALSO TOLD PATIENT IF ITS AN EMERGENCY TO PLEASE COME IN OR EMERGENCY ROOM.

## 2011-11-04 NOTE — Telephone Encounter (Signed)
Can do Addressed to whom

## 2011-11-04 NOTE — Telephone Encounter (Signed)
Called patient, to determine the nature of her emergency. She states she has had concussion, and has had emotional trouble recently. She wants to know if she can get a note to [facility] to try to defer, so she does not have to drop out completely. She wants to go back next fall, she wants to know if you can write letter for her to the [place]. I told her I will ask and we will call her back.

## 2011-11-04 NOTE — ED Notes (Signed)
Chart returned from EDP office . rx for Clindamycin 300 mg TID x 5 days  Written by Felicie Morn needs to be called to Pharmacy.

## 2011-11-05 ENCOUNTER — Telehealth (HOSPITAL_COMMUNITY): Payer: Self-pay | Admitting: Emergency Medicine

## 2011-11-07 NOTE — Telephone Encounter (Signed)
She wants the letter to be sent to her, but it will go to [facility]. She is aware we will call her when it is ready.

## 2011-11-07 NOTE — Telephone Encounter (Signed)
S 

## 2011-11-08 ENCOUNTER — Encounter: Payer: Self-pay | Admitting: Internal Medicine

## 2011-11-08 NOTE — Telephone Encounter (Signed)
A letter is written and waiting to be printed/ I sent message to her by That she can pick it up tomorrow at 104 after 10:30  Please Print and leave in my box to be signed tomorrow

## 2012-03-28 ENCOUNTER — Other Ambulatory Visit: Payer: Self-pay | Admitting: Internal Medicine

## 2012-03-28 DIAGNOSIS — R112 Nausea with vomiting, unspecified: Secondary | ICD-10-CM

## 2012-03-28 MED ORDER — ONDANSETRON HCL 8 MG PO TABS: 8.0000 mg | ORAL_TABLET | Freq: Three times a day (TID) | ORAL | Status: DC | PRN

## 2012-03-28 NOTE — Progress Notes (Signed)
Mother calls requesting meds for nausea and vomiting related to another attempt at withdrawal Meds ordered this encounter  Medications  . ondansetron (ZOFRAN) 8 MG tablet    Sig: Take 1 tablet (8 mg total) by mouth every 8 (eight) hours as needed for nausea.    Dispense:  21 tablet    Refill:  2

## 2012-04-03 ENCOUNTER — Telehealth: Payer: Self-pay

## 2012-04-03 NOTE — Telephone Encounter (Signed)
Will forward this to her treating provider for review prior to refill.

## 2012-04-03 NOTE — Telephone Encounter (Signed)
Pt requesting trazadone refill    Best phone [phone]   Pharmacy [place]

## 2012-04-04 MED ORDER — TRAZODONE HCL 100 MG PO TABS: ORAL_TABLET | ORAL | Status: DC

## 2012-04-04 NOTE — Telephone Encounter (Signed)
Still struggling with ETOH addiction--has been thru another inpat rx w/out success Working with Mom again for support Ask her to come back to see me if talking will help  Meds ordered this encounter  Medications  . traZODone (DESYREL) 100 MG tablet    Sig: 1/2 to 1 at bedtime    Dispense:  30 tablet    Refill:  0

## 2012-04-04 NOTE — Telephone Encounter (Signed)
Called her to advise.  

## 2012-07-11 ENCOUNTER — Ambulatory Visit (INDEPENDENT_AMBULATORY_CARE_PROVIDER_SITE_OTHER): Payer: BC Managed Care – PPO | Admitting: Physician Assistant

## 2012-07-11 VITALS — BP 120/76 | HR 110 | Temp 98.0°F | Resp 18 | Ht 66.5 in | Wt 116.8 lb

## 2012-07-11 DIAGNOSIS — R112 Nausea with vomiting, unspecified: Secondary | ICD-10-CM

## 2012-07-11 DIAGNOSIS — L02419 Cutaneous abscess of limb, unspecified: Secondary | ICD-10-CM

## 2012-07-11 DIAGNOSIS — IMO0002 Reserved for concepts with insufficient information to code with codable children: Secondary | ICD-10-CM

## 2012-07-11 MED ORDER — DOXYCYCLINE HYCLATE 100 MG PO CAPS: 100.0000 mg | ORAL_CAPSULE | Freq: Two times a day (BID) | ORAL | Status: DC

## 2012-07-11 NOTE — Patient Instructions (Signed)
Keep the foot elevated and apply a warm compress to the area for 15-20 minutes 2-3 times each day.

## 2012-07-11 NOTE — Progress Notes (Signed)
  Subjective:    Patient ID: Alexandra Osborne, female    DOB: 10-12-1985, 27 y.o.   MRN: [id number]  HPI This 27 y.o. female presents for evaluation of a painful lesion on the RIGHT ankle x 1 week.  She believes that she was bitten by a spider.  She tried to squeeze the lesion and got a small amount of purulence, but mostly blood.  The pain has been worsening, and the area getting more red and swollen. No fever/chills.  Past medical history, surgical history, family history, social history and problem list reviewed.  She is accompanied by her father.  Review of Systems As above.    Objective:   Physical Exam  Vitals reviewed. Constitutional: She is oriented to person, place, and time. She appears well-developed and well-nourished. She is active and cooperative. She appears distressed (very anxious, tearful).  Eyes: Conjunctivae are normal. Pupils are equal, round, and reactive to light.  Cardiovascular: Normal rate.   Pulmonary/Chest: Effort normal.  Musculoskeletal:       Right ankle: Achilles tendon normal.       Feet:  Neurological: She is alert and oriented to person, place, and time.  Skin: Skin is warm and dry. Lesion (3 cm of erythema; centrally 1 cm of raised purplish colered skin forming a bullous lesion with central excoriation) noted.     Psychiatric: She has a normal mood and affect. Her behavior is normal. Thought content normal.   BP 120/76  Pulse 110  Temp(Src) 98 F (36.7 C) (Oral)  Resp 18  Ht 5' 6.5" (1.689 m)  Wt 116 lb 12.8 oz (52.98 kg)  BMI 18.57 kg/m2  SpO2 97%  LMP 07/04/2012  Crust lifted with the dull side of an 11 blade.  Eschar adherent centrally.  When pressure applied, some purulence expressed around it, but the patient was unable to tolerate the pain.  With her permission, 1.5 cc 1% lidocaine plain injected and the eschar removed with forceps and purulence expressed.  Wound packed with scant 1/4 inch plain packing.  Dressed.      Assessment &  Plan:  Abscess or cellulitis of ankle - Plan: doxycycline (VIBRAMYCIN) 100 MG capsule, Wound culture Elevate.  Warm compresses.  RTC tomorrow for re-evaluation.  Fernande Bras, PA-C Physician Assistant-Certified [facility]

## 2012-07-12 ENCOUNTER — Ambulatory Visit (INDEPENDENT_AMBULATORY_CARE_PROVIDER_SITE_OTHER): Payer: BC Managed Care – PPO | Admitting: Physician Assistant

## 2012-07-12 ENCOUNTER — Encounter: Payer: Self-pay | Admitting: Physician Assistant

## 2012-07-12 VITALS — BP 130/80 | HR 97 | Temp 98.3°F | Resp 18 | Ht 66.5 in | Wt 118.0 lb

## 2012-07-12 DIAGNOSIS — IMO0002 Reserved for concepts with insufficient information to code with codable children: Secondary | ICD-10-CM

## 2012-07-12 DIAGNOSIS — R112 Nausea with vomiting, unspecified: Secondary | ICD-10-CM

## 2012-07-12 DIAGNOSIS — L02419 Cutaneous abscess of limb, unspecified: Secondary | ICD-10-CM

## 2012-07-12 MED ORDER — PROMETHAZINE HCL 25 MG/ML IJ SOLN
25.0000 mg | Freq: Once | INTRAMUSCULAR | Status: AC
  Administered 2012-07-12: 25 mg via INTRAMUSCULAR

## 2012-07-12 NOTE — Progress Notes (Signed)
  Subjective:    Patient ID: Alexandra Osborne, female    DOB: 1985/07/29, 27 y.o.   MRN: [id number]  HPI  Presents for follow up of abscess which was originally seen and I&D on 07/11/12. Abscess is on right medial ankle. Pt states abscess is still very painful, she was unable to sleep due to pain, and now feels nauseous.   Review of Systems     Objective:   Physical Exam  BP 130/80  Pulse 97  Temp(Src) 98.3 F (36.8 C)  Resp 18  Ht 5' 6.5" (1.689 m)  Wt 118 lb (53.524 kg)  BMI 18.76 kg/m2  SpO2 98%  LMP 07/04/2012  General: WDWN female, NAD  HEENT: normocephalic, atraumatic, sclera/cnjunctiva clear  Resp: good air entry, CTA bilaterally  Cardiac: RRR, no murmurs rubs gallops  Extremities: moves all limbs spontaneously  Neuro: A&O x 3 Skin: abscess right medial ankle with minimal induration, packing still intact   Packing is removed, area is cleansed. 3cc of 2& lidocaine plain dripped into wound. Escar was removed and 5mm strip of 1/4 in packing was used.      Assessment & Plan:  Abscess or cellulitis of ankle  Nausea with vomiting - Plan: promethazine (PHENERGAN) injection 25 mg  RTC tomorrow for wound check.  Warm compresses, elevation.

## 2012-07-12 NOTE — Progress Notes (Signed)
I have examined this patient along with the student and agree. I directly supervised and participated in the procedure and agree with the student's documentation.  

## 2012-07-13 ENCOUNTER — Ambulatory Visit (INDEPENDENT_AMBULATORY_CARE_PROVIDER_SITE_OTHER): Payer: BC Managed Care – PPO | Admitting: Physician Assistant

## 2012-07-13 VITALS — BP 110/78 | HR 73 | Temp 98.0°F | Resp 16 | Ht 67.0 in | Wt 115.0 lb

## 2012-07-13 DIAGNOSIS — L03119 Cellulitis of unspecified part of limb: Secondary | ICD-10-CM

## 2012-07-13 DIAGNOSIS — IMO0002 Reserved for concepts with insufficient information to code with codable children: Secondary | ICD-10-CM

## 2012-07-13 NOTE — Patient Instructions (Signed)
Wash the wound daily with soap and water. Keep the wound covered until it scabs over and is dry. Complete the antibiotic. Continue to apply warm compresses and keep elevated until the wound is completely healed.

## 2012-07-13 NOTE — Progress Notes (Signed)
  Subjective:    Patient ID: Alexandra Osborne, female    DOB: 09/13/1985, 27 y.o.   MRN: [id number]  HPI Alexandra Osborne is a 27 y.o. female presenting for wound recheck on RIGHT ankle.  I&Ded two days ago.  Area is less swollen and red.  Pain is getting better.  Not using anything for pain.  Some drainage - yellowish.  Took a shower yesterday and kept it out of water best she could.  Re-dressed at home.    Denies fever, nausea, vomiting, AP, sore throat.    Past medical history, surgical history, medications, allergies, social history and family history have been reviewed.  Review of Systems As stated in HPI - otherwise negative.    Objective:   Physical Exam Filed Vitals:   07/13/12 0918  BP: 110/78  Pulse: 73  Temp: 98 F (36.7 C)  TempSrc: Oral  Resp: 16  Height: 5\' 7"  (1.702 m)  Weight: 115 lb (52.164 kg)  SpO2: 100%   Skin:  Abscess on right medial ankle.  Central ulceration.  Packing intact.  Erythema surrounding.  Tender to palpation.  Packing removed, revealing healthy wound bed.  No additional purulence.  No packing placed.  Bandage applied.     Assessment & Plan:  Abscess or cellulitis of ankle  Patient Instructions  Wash the wound daily with soap and water. Keep the wound covered until it scabs over and is dry. Complete the antibiotic. Continue to apply warm compresses and keep elevated until the wound is completely healed.    RTC PRN

## 2012-07-13 NOTE — Progress Notes (Signed)
I have examined this patient along with the student and agree.  

## 2012-07-14 LAB — WOUND CULTURE

## 2012-07-18 ENCOUNTER — Telehealth: Payer: Self-pay

## 2012-07-18 NOTE — Telephone Encounter (Signed)
PT STATES SHE WAS TO GET A CALL BACK ABOUT THE BACTERIA SHE MAY HAVE CONTACTED.  PLEASE CALL [phone]

## 2012-07-19 ENCOUNTER — Telehealth: Payer: Self-pay

## 2012-07-19 NOTE — Telephone Encounter (Signed)
Please call patient.  Wound culture reveals MRSA, and that she's on the correct antibiotic. Please return if it's not improving, or if symptoms worsen or recur.  unfortunately message was left for her, instead of being told what was going on, called her, mail box full. I am sure she has questions, when she calls back I will advise.

## 2012-07-19 NOTE — Telephone Encounter (Signed)
Called again, mailbox full

## 2012-07-19 NOTE — Telephone Encounter (Signed)
Pt returning our call re infection??   Best phone number [phone]

## 2012-07-19 NOTE — Telephone Encounter (Signed)
Called see other message 

## 2012-09-27 ENCOUNTER — Encounter (HOSPITAL_COMMUNITY): Payer: Self-pay | Admitting: Emergency Medicine

## 2012-09-27 ENCOUNTER — Emergency Department (HOSPITAL_COMMUNITY)
Admission: EM | Admit: 2012-09-27 | Discharge: 2012-09-27 | Disposition: A | Discharge status: Home / Self Care | Payer: BC Managed Care – PPO | Attending: Emergency Medicine | Admitting: Emergency Medicine

## 2012-09-27 ENCOUNTER — Emergency Department (HOSPITAL_COMMUNITY): Payer: BC Managed Care – PPO

## 2012-09-27 DIAGNOSIS — W1809XA Striking against other object with subsequent fall, initial encounter: Secondary | ICD-10-CM | POA: Insufficient documentation

## 2012-09-27 DIAGNOSIS — R4182 Altered mental status, unspecified: Secondary | ICD-10-CM | POA: Insufficient documentation

## 2012-09-27 DIAGNOSIS — Z3202 Encounter for pregnancy test, result negative: Secondary | ICD-10-CM | POA: Insufficient documentation

## 2012-09-27 DIAGNOSIS — S0990XA Unspecified injury of head, initial encounter: Secondary | ICD-10-CM | POA: Insufficient documentation

## 2012-09-27 DIAGNOSIS — F3289 Other specified depressive episodes: Secondary | ICD-10-CM | POA: Insufficient documentation

## 2012-09-27 DIAGNOSIS — F17200 Nicotine dependence, unspecified, uncomplicated: Secondary | ICD-10-CM | POA: Insufficient documentation

## 2012-09-27 DIAGNOSIS — Y9289 Other specified places as the place of occurrence of the external cause: Secondary | ICD-10-CM | POA: Insufficient documentation

## 2012-09-27 DIAGNOSIS — F1010 Alcohol abuse, uncomplicated: Secondary | ICD-10-CM | POA: Insufficient documentation

## 2012-09-27 DIAGNOSIS — F329 Major depressive disorder, single episode, unspecified: Secondary | ICD-10-CM | POA: Insufficient documentation

## 2012-09-27 DIAGNOSIS — Y9389 Activity, other specified: Secondary | ICD-10-CM | POA: Insufficient documentation

## 2012-09-27 DIAGNOSIS — S298XXA Other specified injuries of thorax, initial encounter: Secondary | ICD-10-CM | POA: Insufficient documentation

## 2012-09-27 LAB — CBC WITH DIFFERENTIAL/PLATELET
Eosinophils Relative: 5 % (ref 0–5)
HCT: 38.3 % (ref 36.0–46.0)
Hemoglobin: 13.2 g/dL (ref 12.0–15.0)
Lymphocytes Relative: 33 % (ref 12–46)
MCV: 99.2 fL (ref 78.0–100.0)
Monocytes Absolute: 0.5 10*3/uL (ref 0.1–1.0)
Monocytes Relative: 14 % — ABNORMAL HIGH (ref 3–12)
Neutro Abs: 1.5 10*3/uL — ABNORMAL LOW (ref 1.7–7.7)
RDW: 13.1 % (ref 11.5–15.5)
WBC: 3.2 10*3/uL — ABNORMAL LOW (ref 4.0–10.5)

## 2012-09-27 LAB — RAPID URINE DRUG SCREEN, HOSP PERFORMED
Amphetamines: NOT DETECTED
Barbiturates: NOT DETECTED
Benzodiazepines: NOT DETECTED
Tetrahydrocannabinol: NOT DETECTED

## 2012-09-27 LAB — COMPREHENSIVE METABOLIC PANEL
AST: 100 U/L — ABNORMAL HIGH (ref 0–37)
Albumin: 4.3 g/dL (ref 3.5–5.2)
Alkaline Phosphatase: 92 U/L (ref 39–117)
BUN: 7 mg/dL (ref 6–23)
Chloride: 99 mEq/L (ref 96–112)
Creatinine, Ser: 0.55 mg/dL (ref 0.50–1.10)
Potassium: 4 mEq/L (ref 3.5–5.1)
Total Bilirubin: 0.3 mg/dL (ref 0.3–1.2)
Total Protein: 7.5 g/dL (ref 6.0–8.3)

## 2012-09-27 LAB — PREGNANCY, URINE: Preg Test, Ur: NEGATIVE

## 2012-09-27 LAB — ETHANOL: Alcohol, Ethyl (B): 404 mg/dL (ref 0–11)

## 2012-09-27 MED ORDER — SODIUM CHLORIDE 0.9 % IV BOLUS (SEPSIS)
1000.0000 mL | Freq: Once | INTRAVENOUS | Status: AC
  Administered 2012-09-27: 1000 mL via INTRAVENOUS

## 2012-09-27 NOTE — ED Provider Notes (Signed)
CSN: [id number]     Arrival date & time 09/27/12  0645 History   First MD Initiated Contact with Patient 09/27/12 0703     Chief Complaint  Patient presents with  . Alcohol Intoxication   level V caveat due to altered mental status  Time(Consider location/radiation/quality/duration/timing/severity/associated sxs/prior Treatment) Patient is a 27 y.o. female presenting with intoxication. The history is provided by the patient.  Alcohol Intoxication Associated symptoms include chest pain.   patient presents with altered mental status. She reportedly was at someone's house for a gathering and had been drinking. She states then the   person's mother came and made them leave. She states she fell on the way out and hit her head. There may be loss of consciousness. Patient has been somewhat confused since. She states she's had 2 previous concussions. She states she's had 4 glasses of wine and one alcoholic drink. She denies drug use. She states she does not know what happened after that. States she's otherwise healthy Past Medical History  Diagnosis Date  . Depression    History reviewed. No pertinent past surgical history. Family History  Problem Relation Age of Onset  . Stroke Maternal Grandfather   . Cancer Maternal Grandmother   . Diabetes Maternal Grandmother   . Stroke Other   . Cancer Other   . Hypertension Other    History  Substance Use Topics  . Smoking status: Current Some Day Smoker    Types: Cigarettes  . Smokeless tobacco: Never Used  . Alcohol Use: 0.0 oz/week     Comment: substance (alcohol) abuse   OB History   Grav Para Term Preterm Abortions TAB SAB Ect Mult Living                 Review of Systems  Unable to perform ROS: Mental status change  Cardiovascular: Positive for chest pain.    Allergies  Review of patient's allergies indicates no known allergies.  Home Medications   Current Outpatient Rx  Name  Route  Sig  Dispense  Refill  . aspirin 325 MG  tablet   Oral   Take 650 mg by mouth daily as needed for pain (headache).         . traZODone (DESYREL) 100 MG tablet   Oral   Take 50 mg by mouth at bedtime as needed for sleep.          BP 91/64  Pulse 88  Temp(Src) 97.5 F (36.4 C) (Oral)  Resp 18  SpO2 100%  LMP 09/13/2012 Physical Exam  Nursing note and vitals reviewed. Constitutional: She appears well-developed and well-nourished.  HENT:  Head: Normocephalic.  Tenderness right occipital area.  Eyes: EOM are normal. Pupils are equal, round, and reactive to light.  Pupils are mildly dilated  Neck: Normal range of motion. Neck supple.  Cardiovascular: Normal rate, regular rhythm and normal heart sounds.   No murmur heard. Pulmonary/Chest: Effort normal and breath sounds normal. No respiratory distress. She has no wheezes. She has no rales. She exhibits no tenderness.  Abdominal: Soft. Bowel sounds are normal. She exhibits no distension. There is no tenderness. There is no rebound and no guarding.  Musculoskeletal: Normal range of motion.  Neurological: She is alert. No cranial nerve deficit.  Patient is awake and appropriate, but appears intoxicated  Skin: Skin is warm and dry.  Psychiatric: She has a normal mood and affect. Her speech is normal.    ED Course  Procedures (including critical care time) Labs  Review Labs Reviewed  ETHANOL - Abnormal; Notable for the following:    Alcohol, Ethyl (B) 404 (*)    All other components within normal limits  COMPREHENSIVE METABOLIC PANEL - Abnormal; Notable for the following:    AST 100 (*)    ALT 60 (*)    All other components within normal limits  CBC WITH DIFFERENTIAL - Abnormal; Notable for the following:    WBC 3.2 (*)    RBC 3.86 (*)    MCH 34.2 (*)    Platelets 148 (*)    Neutro Abs 1.5 (*)    Monocytes Relative 14 (*)    Basophils Relative 2 (*)    All other components within normal limits  PREGNANCY, URINE  URINE RAPID DRUG SCREEN (HOSP PERFORMED)    Imaging Review Ct Head Wo Contrast  09/27/2012   CLINICAL DATA:  Mental status changes.  EXAM: CT HEAD WITHOUT CONTRAST  TECHNIQUE: Contiguous axial images were obtained from the base of the skull through the vertex without intravenous contrast.  COMPARISON:  10/31/2011.  FINDINGS: The ventricles are normal in size and configuration. Cavum septum pellucidum et verge again noted. No extra-axial fluid collections are identified. The gray-white differentiation is normal. No CT findings for acute intracranial process such as hemorrhage or infarction. No mass lesions. The brainstem and cerebellum are grossly normal.  The bony structures are intact. The paranasal sinuses and mastoid air cells are clear. The globes are intact.  IMPRESSION: Normal head CT.   Electronically Signed   By: Loralie Champagne M.D.   On: 09/27/2012 07:52    MDM   1. Alcohol abuse    Patient with altered mental status. Had possible fall. Head CT reassuring. Patient is a chronic alcoholic per family member. Her alcohol level is 400 and she was awake and appropriate. She does not want to do inpatient treatment and states that she is counselor she can talk to. Mother was worried that she is a risk for self drinking. Patient is not suicidal or homicidal. She was seen by social work and psychiatry in the ED and did not find a reason for involuntary commitment. She was discharged home with resources.    Juliet Rude. Rubin Payor, MD 09/27/12 1055

## 2012-09-27 NOTE — BH Assessment (Signed)
Assessment Note  Alexandra Osborne is an 27 y.o. female. Who presents to the ED after leaving a party intoxicated, falling and knocking on a neighbors door. CSW met with pt to complete [facility] assessment. Pt reports that she does not want anyone talking to her mother. Pt reports she does not want any substance abuse treatment. Pt reprots that she has not slept in 48 hours, took trazodone to try to sleep and had 4 alcoholic drinks. Pt reports she has gone to rehab before, however is not interested in any type of susbtance abuse treatment. Pt denies SI/HI/AH/VH. CSW discussed with psychiatrist who will evaluate pt further. CSW discussed with EDP as well. Pt mother reports she wants to ivc patient to force her to detox. At this time, pt does not meet criteria for IVC.   Axis I: Alcohol Abuse Axis II: Deferred Axis III:  Past Medical History  Diagnosis Date  . Depression    Axis IV: economic problems, occupational problems, other psychosocial or environmental problems, problems related to social environment and problems with primary support group Axis V: 41-50 serious symptoms  Past Medical History:  Past Medical History  Diagnosis Date  . Depression     History reviewed. No pertinent past surgical history.  Family History:  Family History  Problem Relation Age of Onset  . Stroke Maternal Grandfather   . Cancer Maternal Grandmother   . Diabetes Maternal Grandmother   . Stroke Other   . Cancer Other   . Hypertension Other     Social History:  reports that she has been smoking Cigarettes.  She has been smoking about 0.00 packs per day. She has never used smokeless tobacco. She reports that  drinks alcohol. She reports that she uses illicit drugs (Marijuana).  Additional Social History:  Alcohol / Drug Use History of alcohol / drug use?: Yes Substance #1 Name of Substance 1: alcohol  1 - Age of First Use: teens 1 - Amount (size/oz): 4 drinks 1 - Frequency: occasional 1 - Duration:  years 1 - Last Use / Amount: 4 drinks   CIWA: CIWA-Ar BP: 112/81 mmHg Pulse Rate: 80 COWS:    Allergies: No Known Allergies  Home Medications:  (Not in a hospital admission)  OB/GYN Status:  Patient's last menstrual period was 09/13/2012.  General Assessment Data Location of Assessment: [facility] ED Is this a Tele or Face-to-Face Assessment?: Face-to-Face Is this an Initial Assessment or a Re-assessment for this encounter?: Initial Assessment Living Arrangements: Alone Can pt return to current living arrangement?: Yes Admission Status: Voluntary Is patient capable of signing voluntary admission?: Yes Transfer from: Other (Comment) (gpd/ems ) Referral Source: Other (gpd/ems)     [facility] Crisis Care Plan Living Arrangements: Alone  Education Status Is patient currently in school?: No Highest grade of school patient has completed: beachelors  Risk to self Suicidal Ideation: No Suicidal Intent: No Is patient at risk for suicide?: No Suicidal Plan?: No Access to Means: No What has been your use of drugs/alcohol within the last 12 months?: alchol Previous Attempts/Gestures: No How many times?: 0 Other Self Harm Risks: no Triggers for Past Attempts: None known Intentional Self Injurious Behavior: None Family Suicide History: No Recent stressful life event(s): Conflict (Comment) (parents and alcohol, quit job ) Persecutory voices/beliefs?: No Depression: No Substance abuse history and/or treatment for substance abuse?: Yes  Risk to Others Homicidal Ideation: No Thoughts of Harm to Others: No Current Homicidal Intent: No Current Homicidal Plan: No Access to Homicidal Means: No  Identified Victim: n/a History of harm to others?: No Assessment of Violence: None Noted Violent Behavior Description: none Does patient have access to weapons?: No Criminal Charges Pending?: No Does patient have a court date: No  Psychosis Hallucinations: None noted Delusions: None  noted  Mental Status Report Appear/Hygiene: Disheveled Eye Contact: Good Motor Activity: Freedom of movement Speech: Logical/coherent Level of Consciousness: Alert Mood: Irritable Affect: Appropriate to circumstance Anxiety Level: Minimal Thought Processes: Coherent;Relevant Judgement: Unimpaired Orientation: Person;Place;Time;Situation Obsessive Compulsive Thoughts/Behaviors: None  Cognitive Functioning Concentration: Normal Memory: Recent Intact;Remote Intact IQ: Average Insight: Fair Impulse Control: Fair Appetite: Fair Sleep: Decreased Total Hours of Sleep: 0 Vegetative Symptoms: None  ADLScreening [facility] Assessment Services) Patient's cognitive ability adequate to safely complete daily activities?: Yes Patient able to express need for assistance with ADLs?: Yes Independently performs ADLs?: Yes (appropriate for developmental age)  Prior Inpatient Therapy Prior Inpatient Therapy: Yes Prior Therapy Facilty/Provider(s): [facility] Reason for Treatment: alcohol abuse  Prior Outpatient Therapy Prior Outpatient Therapy: No  ADL Screening (condition at time of admission) Patient's cognitive ability adequate to safely complete daily activities?: Yes Patient able to express need for assistance with ADLs?: Yes Independently performs ADLs?: Yes (appropriate for developmental age)         Values / Beliefs Cultural Requests During Hospitalization: None Spiritual Requests During Hospitalization: None        Additional Information 1:1 In Past 12 Months?: No CIRT Risk: No Elopement Risk: No Does patient have medical clearance?: Yes     Disposition:  Disposition Initial Assessment Completed for this Encounter: Yes Disposition of Patient: Outpatient treatment Type of outpatient treatment: Adult  On Site Evaluation by:   Reviewed with Physician:    Catha Gosselin A 09/27/2012 8:53 AM

## 2012-09-27 NOTE — ED Notes (Signed)
Pharmacy TEch reported that the patient told her that she had taken 3 Trazadone tabs last night instead of the prescribed dose of 1 tab.

## 2012-09-27 NOTE — ED Notes (Signed)
Bed: WU98 Expected date:  Expected time:  Means of arrival:  Comments: EMS, ETOH

## 2012-09-27 NOTE — Consult Note (Signed)
  Psychiatric Specialty Exam: @PHYSEXAMBYAGE2 @  @ROS @  Blood pressure 112/81, pulse 80, temperature 97.5 F (36.4 C), temperature source Oral, resp. rate 20, last menstrual period 09/13/2012, SpO2 99.00%.There is no weight on file to calculate BMI.  General Appearance: Casual  Eye Contact::  Good  Speech:  Clear and Coherent  Volume:  Decreased  Mood:  Irritable  Affect:  Appropriate  Thought Process:  Goal Directed and Logical  Orientation:  Full (Time, Place, and Person)  Thought Content:  Negative  Suicidal Thoughts:  No  Homicidal Thoughts:  No  Memory:  Immediate;   Good Recent;   Fair Remote;   Good  Judgement:  Intact  Insight:  Fair  Psychomotor Activity:  currently in bed but no history of problems with walking  Concentration:  Fair  Recall:  Fair  Akathisia:  Negative  Handed:  Right  AIMS (if indicated):     Assets:  Communication Skills Physical Health Social Support Talents/Skills  Sleep:   always has trouble sleeping   Alexandra Osborne has not slept for 48 hrs she said.  Consequently she feels a bit foggy.  She was able to answer questions and was clear that she was not suicidal or homicidal.  She has no interest in rehab.  Her mother was with her and very much wants her to get help for her drinking and seems upset that she cannot be forced by [place] to get help.  After the patient gets some sleep she might respond to  Intervention better. She does not meet criteria for involuntary commitment, consequently, she may be discharged home when she is medically clear.

## 2012-09-27 NOTE — ED Notes (Signed)
Patient had a female visitor and female visitor brought the patient's purse and gave it to the patient's mother. Patient calm with visitor.

## 2012-09-27 NOTE — ED Notes (Signed)
Alexandra Osborne, pts mother  Cell [phone]

## 2012-09-27 NOTE — ED Notes (Signed)
Per EMS, pt was drinking at a friend's house when she wandered off.  Pt ended up at a neighbors house knocking on the door.  The neighbor called the police and [employer] called EMS d/t the pt being intoxicated.

## 2013-03-21 ENCOUNTER — Emergency Department (HOSPITAL_COMMUNITY)
Admission: EM | Admit: 2013-03-21 | Discharge: 2013-03-21 | Payer: BC Managed Care – PPO | Attending: Emergency Medicine | Admitting: Emergency Medicine

## 2013-03-21 ENCOUNTER — Encounter (HOSPITAL_COMMUNITY): Payer: Self-pay | Admitting: Emergency Medicine

## 2013-03-21 DIAGNOSIS — F3289 Other specified depressive episodes: Secondary | ICD-10-CM | POA: Insufficient documentation

## 2013-03-21 DIAGNOSIS — F329 Major depressive disorder, single episode, unspecified: Secondary | ICD-10-CM | POA: Insufficient documentation

## 2013-03-21 DIAGNOSIS — Z0289 Encounter for other administrative examinations: Secondary | ICD-10-CM | POA: Insufficient documentation

## 2013-03-21 DIAGNOSIS — Z008 Encounter for other general examination: Secondary | ICD-10-CM

## 2013-03-21 DIAGNOSIS — F17200 Nicotine dependence, unspecified, uncomplicated: Secondary | ICD-10-CM | POA: Insufficient documentation

## 2013-03-21 DIAGNOSIS — F1010 Alcohol abuse, uncomplicated: Secondary | ICD-10-CM | POA: Insufficient documentation

## 2013-03-21 LAB — BASIC METABOLIC PANEL
BUN: 8 mg/dL (ref 6–23)
CHLORIDE: 97 meq/L (ref 96–112)
CO2: 26 mEq/L (ref 19–32)
CREATININE: 0.5 mg/dL (ref 0.50–1.10)
Calcium: 9.4 mg/dL (ref 8.4–10.5)
GFR calc Af Amer: >90 mL/min (ref >90)
Glucose, Bld: 99 mg/dL (ref 70–99)
Potassium: 3.6 mEq/L — ABNORMAL LOW (ref 3.7–5.3)
Sodium: 143 mEq/L (ref 137–147)

## 2013-03-21 LAB — CBC WITH DIFFERENTIAL/PLATELET
Basophils Absolute: 0.1 10*3/uL (ref 0.0–0.1)
Basophils Relative: 2 % — ABNORMAL HIGH (ref 0–1)
EOS ABS: 0.1 10*3/uL (ref 0.0–0.7)
Eosinophils Relative: 3 % (ref 0–5)
HCT: 35.6 % — ABNORMAL LOW (ref 36.0–46.0)
HEMOGLOBIN: 12.3 g/dL (ref 12.0–15.0)
Lymphocytes Relative: 30 % (ref 12–46)
Lymphs Abs: 0.8 10*3/uL (ref 0.7–4.0)
MCH: 35.9 pg — AB (ref 26.0–34.0)
MCHC: 34.6 g/dL (ref 30.0–36.0)
MCV: 103.8 fL — AB (ref 78.0–100.0)
MONO ABS: 0.3 10*3/uL (ref 0.1–1.0)
MONOS PCT: 10 % (ref 3–12)
Neutro Abs: 1.5 10*3/uL — ABNORMAL LOW (ref 1.7–7.7)
Neutrophils Relative %: 55 % (ref 43–77)
Platelets: 171 10*3/uL (ref 150–400)
RBC: 3.43 MIL/uL — ABNORMAL LOW (ref 3.87–5.11)
RDW: 14 % (ref 11.5–15.5)
WBC: 2.8 10*3/uL — ABNORMAL LOW (ref 4.0–10.5)

## 2013-03-21 NOTE — ED Provider Notes (Signed)
CSN: [id number]     Arrival date & time 03/21/13  1319 History   First MD Initiated Contact with Patient 03/21/13 1442     Chief Complaint  Patient presents with  . Medical Clearance  . Alcohol Intoxication     (Consider location/radiation/quality/duration/timing/severity/associated sxs/prior Treatment) HPI Comments: Patient is a 28 year old female with no past medical history who presents via [employer] for medical clearance before being taken to jail for DUI. Patient was pulled over earlier today for swerving on the road. Patient admits to drinking wine last night but denies drinking today. She has no medical complaints. Police is requesting she be medically cleared before being taken to jail.    Past Medical History  Diagnosis Date  . Depression    History reviewed. No pertinent past surgical history. Family History  Problem Relation Age of Onset  . Stroke Maternal Grandfather   . Cancer Maternal Grandmother   . Diabetes Maternal Grandmother   . Stroke Other   . Cancer Other   . Hypertension Other    History  Substance Use Topics  . Smoking status: Current Some Day Smoker    Types: Cigarettes  . Smokeless tobacco: Never Used  . Alcohol Use: 0.0 oz/week     Comment: substance (alcohol) abuse   OB History   Grav Para Term Preterm Abortions TAB SAB Ect Mult Living                 Review of Systems  Constitutional: Negative for fever, chills and fatigue.  HENT: Negative for trouble swallowing.   Eyes: Negative for visual disturbance.  Respiratory: Negative for shortness of breath.   Cardiovascular: Negative for chest pain and palpitations.  Gastrointestinal: Negative for nausea, vomiting, abdominal pain and diarrhea.  Genitourinary: Negative for dysuria and difficulty urinating.  Musculoskeletal: Negative for arthralgias and neck pain.  Skin: Negative for color change.  Neurological: Negative for dizziness and weakness.  Psychiatric/Behavioral: Negative for dysphoric mood.       Allergies  Review of patient's allergies indicates no known allergies.  Home Medications   Current Outpatient Rx  Name  Route  Sig  Dispense  Refill  . traZODone (DESYREL) 100 MG tablet   Oral   Take 50 mg by mouth at bedtime as needed for sleep.          BP 120/83  Pulse 92  Temp(Src) 98.5 F (36.9 C) (Oral)  Resp 20  SpO2 99%  LMP 02/21/2013 Physical Exam  Nursing note and vitals reviewed. Constitutional: She is oriented to person, place, and time. She appears well-developed and well-nourished. No distress.  HENT:  Head: Normocephalic and atraumatic.  Eyes: Conjunctivae and EOM are normal.  Neck: Normal range of motion.  Cardiovascular: Normal rate and regular rhythm.  Exam reveals no gallop and no friction rub.   No murmur heard. Pulmonary/Chest: Effort normal and breath sounds normal. She has no wheezes. She has no rales. She exhibits no tenderness.  Abdominal: Soft. She exhibits no distension. There is no tenderness. There is no rebound.  Musculoskeletal: Normal range of motion.  Neurological: She is alert and oriented to person, place, and time. Coordination normal.  Speech is goal-oriented. Moves limbs without ataxia.   Skin: Skin is warm and dry.  Psychiatric: She has a normal mood and affect. Her behavior is normal.    ED Course  Procedures (including critical care time) Labs Review Labs Reviewed  CBC WITH DIFFERENTIAL - Abnormal; Notable for the following:  WBC 2.8 (*)    RBC 3.43 (*)    HCT 35.6 (*)    MCV 103.8 (*)    MCH 35.9 (*)    Neutro Abs 1.5 (*)    Basophils Relative 2 (*)    All other components within normal limits  BASIC METABOLIC PANEL - Abnormal; Notable for the following:    Potassium 3.6 (*)    All other components within normal limits   Imaging Review No results found.   EKG Interpretation None      MDM   Final diagnoses:  Medical clearance for incarceration    3:31 PM Labs pending. Vitals stable and patient  afebrile. Patient currently eating and drinking.   5:01 PM Labs unremarkable for acute changes. No further evaluation needed at this time. Vitals stable and patient afebrile.   Emilia Beck, PA-C 03/21/13 1702

## 2013-03-21 NOTE — ED Notes (Signed)
Bed: [facility] Expected date: 03/21/13 Expected time: 1:17 PM Means of arrival: Police Comments: Med clear

## 2013-03-21 NOTE — ED Notes (Signed)
Pt brought in by [employer] for medical clearance for alcohol after blowing a 0.38. Pt states she is unsure why she is here or why her alcohol level is so high. Pt states that she hasn't had any alcoholic beverages since last night at 2230, which she sates she had "a few glasses of wine." Pt denies SI/HI. Pt states "I must have swerved on the road" while driving home.

## 2013-03-22 NOTE — ED Provider Notes (Signed)
Medical screening examination/treatment/procedure(s) were performed by non-physician practitioner and as supervising physician I was immediately available for consultation/collaboration.   Sonda Coppens R Casten Floren, MD 03/22/13 0710 

## 2013-05-30 ENCOUNTER — Telehealth: Payer: Self-pay

## 2013-05-30 NOTE — Telephone Encounter (Signed)
Pt called for a refill on traZODone (DESYREL) 100 MG tablet; was last seen 07/13/12

## 2013-05-31 NOTE — Telephone Encounter (Signed)
We have not prescribed this med in >2 yrs. Pt advised to RTC to see Dr. Merla Riches.

## 2013-06-11 ENCOUNTER — Emergency Department (HOSPITAL_COMMUNITY)
Admission: EM | Admit: 2013-06-11 | Discharge: 2013-06-11 | Disposition: A | Discharge status: Home / Self Care | Payer: BC Managed Care – PPO | Attending: Emergency Medicine | Admitting: Emergency Medicine

## 2013-06-11 ENCOUNTER — Encounter (HOSPITAL_COMMUNITY): Payer: Self-pay | Admitting: Emergency Medicine

## 2013-06-11 DIAGNOSIS — Y929 Unspecified place or not applicable: Secondary | ICD-10-CM | POA: Insufficient documentation

## 2013-06-11 DIAGNOSIS — F329 Major depressive disorder, single episode, unspecified: Secondary | ICD-10-CM | POA: Insufficient documentation

## 2013-06-11 DIAGNOSIS — F17200 Nicotine dependence, unspecified, uncomplicated: Secondary | ICD-10-CM | POA: Insufficient documentation

## 2013-06-11 DIAGNOSIS — Y939 Activity, unspecified: Secondary | ICD-10-CM | POA: Insufficient documentation

## 2013-06-11 DIAGNOSIS — F1010 Alcohol abuse, uncomplicated: Secondary | ICD-10-CM

## 2013-06-11 DIAGNOSIS — S01409A Unspecified open wound of unspecified cheek and temporomandibular area, initial encounter: Secondary | ICD-10-CM | POA: Insufficient documentation

## 2013-06-11 DIAGNOSIS — S0181XA Laceration without foreign body of other part of head, initial encounter: Secondary | ICD-10-CM

## 2013-06-11 DIAGNOSIS — W268XXA Contact with other sharp object(s), not elsewhere classified, initial encounter: Secondary | ICD-10-CM | POA: Insufficient documentation

## 2013-06-11 DIAGNOSIS — F3289 Other specified depressive episodes: Secondary | ICD-10-CM | POA: Insufficient documentation

## 2013-06-11 DIAGNOSIS — Z23 Encounter for immunization: Secondary | ICD-10-CM | POA: Insufficient documentation

## 2013-06-11 HISTORY — DX: Other psychoactive substance abuse, uncomplicated: F19.10

## 2013-06-11 LAB — COMPREHENSIVE METABOLIC PANEL
ALBUMIN: 4.2 g/dL (ref 3.5–5.2)
ALT: 70 U/L — ABNORMAL HIGH (ref 0–35)
AST: 94 U/L — AB (ref 0–37)
Alkaline Phosphatase: 111 U/L (ref 39–117)
BUN: 5 mg/dL — ABNORMAL LOW (ref 6–23)
CHLORIDE: 93 meq/L — AB (ref 96–112)
CO2: 23 mEq/L (ref 19–32)
CREATININE: 0.58 mg/dL (ref 0.50–1.10)
Calcium: 8.6 mg/dL (ref 8.4–10.5)
GFR calc Af Amer: >90 mL/min (ref >90)
GFR calc non Af Amer: >90 mL/min (ref >90)
Glucose, Bld: 114 mg/dL — ABNORMAL HIGH (ref 70–99)
Potassium: 3.6 mEq/L — ABNORMAL LOW (ref 3.7–5.3)
Sodium: 139 mEq/L (ref 137–147)
TOTAL PROTEIN: 7.2 g/dL (ref 6.0–8.3)
Total Bilirubin: 0.4 mg/dL (ref 0.3–1.2)

## 2013-06-11 LAB — CBC WITH DIFFERENTIAL/PLATELET
BASOS ABS: 0 10*3/uL (ref 0.0–0.1)
BASOS PCT: 1 % (ref 0–1)
EOS ABS: 0 10*3/uL (ref 0.0–0.7)
Eosinophils Relative: 0 % (ref 0–5)
HEMATOCRIT: 32.2 % — AB (ref 36.0–46.0)
Hemoglobin: 11.4 g/dL — ABNORMAL LOW (ref 12.0–15.0)
Lymphocytes Relative: 12 % (ref 12–46)
Lymphs Abs: 0.3 10*3/uL — ABNORMAL LOW (ref 0.7–4.0)
MCH: 35.1 pg — ABNORMAL HIGH (ref 26.0–34.0)
MCHC: 35.4 g/dL (ref 30.0–36.0)
MCV: 99.1 fL (ref 78.0–100.0)
MONO ABS: 0.1 10*3/uL (ref 0.1–1.0)
Monocytes Relative: 5 % (ref 3–12)
Neutro Abs: 2.3 10*3/uL (ref 1.7–7.7)
Neutrophils Relative %: 83 % — ABNORMAL HIGH (ref 43–77)
Platelets: 86 10*3/uL — ABNORMAL LOW (ref 150–400)
RBC: 3.25 MIL/uL — ABNORMAL LOW (ref 3.87–5.11)
RDW: 13.5 % (ref 11.5–15.5)
WBC: 2.8 10*3/uL — ABNORMAL LOW (ref 4.0–10.5)

## 2013-06-11 LAB — URINALYSIS, ROUTINE W REFLEX MICROSCOPIC
Bilirubin Urine: NEGATIVE
Glucose, UA: NEGATIVE mg/dL
HGB URINE DIPSTICK: NEGATIVE
Ketones, ur: NEGATIVE mg/dL
Leukocytes, UA: NEGATIVE
Nitrite: NEGATIVE
PH: 5 (ref 5.0–8.0)
Protein, ur: NEGATIVE mg/dL
SPECIFIC GRAVITY, URINE: 1.012 (ref 1.005–1.030)
Urobilinogen, UA: 0.2 mg/dL (ref 0.0–1.0)

## 2013-06-11 LAB — ETHANOL: Alcohol, Ethyl (B): 313 mg/dL — ABNORMAL HIGH (ref 0–11)

## 2013-06-11 MED ORDER — TETANUS-DIPHTH-ACELL PERTUSSIS 5-2.5-18.5 LF-MCG/0.5 IM SUSP
0.5000 mL | Freq: Once | INTRAMUSCULAR | Status: AC
  Administered 2013-06-11: 0.5 mL via INTRAMUSCULAR
  Filled 2013-06-11: qty 0.5

## 2013-06-11 MED ORDER — HYDROGEN PEROXIDE 3 % EX SOLN
CUTANEOUS | Status: AC
  Filled 2013-06-11: qty 473

## 2013-06-11 MED ORDER — LORAZEPAM 2 MG/ML IJ SOLN
1.0000 mg | Freq: Once | INTRAMUSCULAR | Status: AC
  Administered 2013-06-11: 1 mg via INTRAVENOUS
  Filled 2013-06-11: qty 1

## 2013-06-11 MED ORDER — ONDANSETRON HCL 4 MG/2ML IJ SOLN: 4.0000 mg | Freq: Once | INTRAMUSCULAR | Status: DC

## 2013-06-11 NOTE — ED Notes (Signed)
Made patient aware of urine sample. Patient stated that she can not go at this time.

## 2013-06-11 NOTE — Discharge Instructions (Signed)
Please follow up with your primary care physician in 1-2 days. If you do not have one please call the [facility] number listed above. Please follow up with Dr. Downs SplinterSanger to schedule a follow up appointment.  Please keep the facial wound clean, dry, and covered. Please read all discharge instructions and return precautions.   Laceration, Old, Not Sutured A laceration is a cut or lesion that goes through all layers of the skin and into the tissue just beneath the skin. Usually these are stitched up or held together with tape or glue shortly after an injury. However, if several or more hours have passed before getting care, too many germs (bacteria) get into the wound. Stitching it closed at this point brings the risk of infection. If your caregiver feels your laceration is too old, it is sometimes left open and dressed regularly to allow healing from the bottom layer up. HOME CARE INSTRUCTIONS   You should change the dressing twice a day or as instructed by your caregiver. If the bandage or wound packing sticks, soak it off with soapy water. When you redress your wound, make sure that the dressing or packing goes all the way to the bottom of the wound. The top of the wound is kept open so it can heal from the bottom up. There is less chance for infection with this method.  Twice a day, wash the area with soap and water to remove all the creams or ointments if used. Rinse off the soap. Pat dry with a clean towel. Look for signs of infection (see below).  Re-apply creams or ointments if they were used to dress the wound. This also helps keep the bandage from sticking.  If the bandage becomes wet, dirty, or develops a foul smell, change it as soon as possible.  Only take over-the-counter or prescription medicines for pain, discomfort, or fever as directed by your caregiver. You might need a tetanus shot now if:  You have no idea when you had the last one.  You have never had a  tetanus shot before.  Your cut had dirt in it.  Your lacertaion was dirty, and your last tetanus shot was more than 7 years ago.  Your laceration was clean, and your last tetanus shot was more than 10 years ago. If you need a tetanus shot, and you decide not to get one, there is a rare chance of getting tetanus. Sickness from tetanus can be serious. If you got a tetanus shot, your arm may swell, get red and warm to the touch at the shot site. This is common and not a problem. SEEK MEDICAL CARE IF:   There is redness, swelling, or increasing pain in the wound.  There is a red line that goes up your arm or leg.  Pus is coming from wound.  You develop an unexplained oral temperature above 102 F (38.9 C).  You notice a foul smell coming from the wound or dressing.  You notice something coming out of the wound such as wood or glass.  The wound is on your hand or foot and you find that you are unable to properly move a finger or toe.  There is severe swelling around the wound causing pain and numbness.  There is a change in color in your arm, hand, leg, or foot. MAKE SURE YOU:   Understand these instructions.  Will watch your condition.  Will get help right away if you are not doing well  or get worse. Document Released: 11/17/2005 Document Revised: 03/14/2011 Document Reviewed: 06/09/2008 [facility] Patient Information 2014 [place], [place].    Alcohol Intoxication Alcohol intoxication occurs when the amount of alcohol that a person has consumed impairs his or her ability to mentally and physically function. Alcohol directly impairs the normal chemical activity of the brain. Drinking large amounts of alcohol can lead to changes in mental function and behavior, and it can cause many physical effects that can be harmful.  Alcohol intoxication can range in severity from mild to very severe. Various factors can affect the level of intoxication that occurs, such as the person's age,  gender, weight, frequency of alcohol consumption, and the presence of other medical conditions (such as diabetes, seizures, or heart conditions). Dangerous levels of alcohol intoxication may occur when people drink large amounts of alcohol in a short period (binge drinking). Alcohol can also be especially dangerous when combined with certain prescription medicines or "recreational" drugs. SIGNS AND SYMPTOMS Some common signs and symptoms of mild alcohol intoxication include:  Loss of coordination.  Changes in mood and behavior.  Impaired judgment.  Slurred speech. As alcohol intoxication progresses to more severe levels, other signs and symptoms will appear. These may include:  Vomiting.  Confusion and impaired memory.  Slowed breathing.  Seizures.  Loss of consciousness. DIAGNOSIS  Your health care provider will take a medical history and perform a physical exam. You will be asked about the amount and type of alcohol you have consumed. Blood tests will be done to measure the concentration of alcohol in your blood. In many places, your blood alcohol level must be lower than 80 mg/dL (9.02%) to legally drive. However, many dangerous effects of alcohol can occur at much lower levels.  TREATMENT  People with alcohol intoxication often do not require treatment. Most of the effects of alcohol intoxication are temporary, and they go away as the alcohol naturally leaves the body. Your health care provider will monitor your condition until you are stable enough to go home. Fluids are sometimes given through an IV access tube to help prevent dehydration.  HOME CARE INSTRUCTIONS  Do not drive after drinking alcohol.  Stay hydrated. Drink enough water and fluids to keep your urine clear or pale yellow. Avoid caffeine.   Only take over-the-counter or prescription medicines as directed by your health care provider.  SEEK MEDICAL CARE IF:   You have persistent vomiting.   You do not feel  better after a few days.  You have frequent alcohol intoxication. Your health care provider can help determine if you should see a substance use treatment counselor. SEEK IMMEDIATE MEDICAL CARE IF:   You become shaky or tremble when you try to stop drinking.   You shake uncontrollably (seizure).   You throw up (vomit) blood. This may be bright red or may look like black coffee grounds.   You have blood in your stool. This may be bright red or may appear as a black, tarry, bad smelling stool.   You become lightheaded or faint.  MAKE SURE YOU:   Understand these instructions.  Will watch your condition.  Will get help right away if you are not doing well or get worse. Document Released: 09/29/2004 Document Revised: 08/22/2012 Document Reviewed: 05/25/2012 [facility] Patient Information 2014 [place], [place].

## 2013-06-11 NOTE — ED Notes (Signed)
MD at bedside.  EDPA AT BS 

## 2013-06-11 NOTE — ED Notes (Signed)
Family at bedside. 
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
## 2013-06-11 NOTE — ED Provider Notes (Signed)
Patient care acquired from Arthor CaptainAbigail Harris, PA-C with urine results pending.   Medications  hydrogen peroxide 3 % external solution (not administered)  LORazepam (ATIVAN) injection 1 mg (1 mg Intravenous Given 06/11/13 1513)  Tdap (BOOSTRIX) injection 0.5 mL (0.5 mLs Intramuscular Given 06/11/13 1516)   Results for orders placed during the hospital encounter of 06/11/13  CBC WITH DIFFERENTIAL      Result Value Ref Range   WBC 2.8 (*) 4.0 - 10.5 K/uL   RBC 3.25 (*) 3.87 - 5.11 MIL/uL   Hemoglobin 11.4 (*) 12.0 - 15.0 g/dL   HCT 45.432.2 (*) 09.836.0 - 11.946.0 %   MCV 99.1  78.0 - 100.0 fL   MCH 35.1 (*) 26.0 - 34.0 pg   MCHC 35.4  30.0 - 36.0 g/dL   RDW 14.713.5  82.911.5 - 56.215.5 %   Platelets 86 (*) 150 - 400 K/uL   Neutrophils Relative % 83 (*) 43 - 77 %   Neutro Abs 2.3  1.7 - 7.7 K/uL   Lymphocytes Relative 12  12 - 46 %   Lymphs Abs 0.3 (*) 0.7 - 4.0 K/uL   Monocytes Relative 5  3 - 12 %   Monocytes Absolute 0.1  0.1 - 1.0 K/uL   Eosinophils Relative 0  0 - 5 %   Eosinophils Absolute 0.0  0.0 - 0.7 K/uL   Basophils Relative 1  0 - 1 %   Basophils Absolute 0.0  0.0 - 0.1 K/uL  COMPREHENSIVE METABOLIC PANEL      Result Value Ref Range   Sodium 139  137 - 147 mEq/L   Potassium 3.6 (*) 3.7 - 5.3 mEq/L   Chloride 93 (*) 96 - 112 mEq/L   CO2 23  19 - 32 mEq/L   Glucose, Bld 114 (*) 70 - 99 mg/dL   BUN 5 (*) 6 - 23 mg/dL   Creatinine, Ser 1.300.58  0.50 - 1.10 mg/dL   Calcium 8.6  8.4 - 86.510.5 mg/dL   Total Protein 7.2  6.0 - 8.3 g/dL   Albumin 4.2  3.5 - 5.2 g/dL   AST 94 (*) 0 - 37 U/L   ALT 70 (*) 0 - 35 U/L   Alkaline Phosphatase 111  39 - 117 U/L   Total Bilirubin 0.4  0.3 - 1.2 mg/dL   GFR calc non Af Amer >90  >90 mL/min   GFR calc Af Amer >90  >90 mL/min  URINALYSIS, ROUTINE W REFLEX MICROSCOPIC      Result Value Ref Range   Color, Urine YELLOW  YELLOW   APPearance CLEAR  CLEAR   Specific Gravity, Urine 1.012  1.005 - 1.030   pH 5.0  5.0 - 8.0   Glucose, UA NEGATIVE  NEGATIVE mg/dL   Hgb  urine dipstick NEGATIVE  NEGATIVE   Bilirubin Urine NEGATIVE  NEGATIVE   Ketones, ur NEGATIVE  NEGATIVE mg/dL   Protein, ur NEGATIVE  NEGATIVE mg/dL   Urobilinogen, UA 0.2  0.0 - 1.0 mg/dL   Nitrite NEGATIVE  NEGATIVE   Leukocytes, UA NEGATIVE  NEGATIVE  ETHANOL      Result Value Ref Range   Alcohol, Ethyl (B) 313 (*) 0 - 11 mg/dL   No results found.  1. Facial laceration   2. ETOH abuse    Filed Vitals:   06/11/13 1659  BP:   Pulse:   Temp: 97.6 F (36.4 C)  Resp:    Afebrile, NAD, non-toxic appearing, AAOx4.   I have reviewed  nursing notes, vital signs, and all appropriate lab and imaging results for this patient. Discussed laboratory results with patient and family. Patient is still adamant about not staying for help with alcohol abuse. Wound care discussed with patient, father is asking for plastic surgery followup, Dr. Leonie Green number was provided. Return precautions were discussed. Patient with safe ride home. Patient / Family / Caregiver informed of clinical course, understand medical decision-making and is agreeable to plan. Patient is stable at time of discharge        WYLMA SOTAK, PA-C 06/11/13 2058
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
## 2013-06-12 NOTE — ED Provider Notes (Signed)
Medical screening examination/treatment/procedure(s) were performed by non-physician practitioner and as supervising physician I was immediately available for consultation/collaboration.   EKG Interpretation None       Indigo Barbian R. Keshaun Dubey, MD 06/12/13 1455 

## 2013-06-14 NOTE — ED Provider Notes (Signed)
Medical screening examination/treatment/procedure(s) were performed by non-physician practitioner and as supervising physician I was immediately available for consultation/collaboration.   EKG Interpretation None        Shon Batonourtney F Horton, MD 06/14/13 [id number]

## 2013-07-15 ENCOUNTER — Emergency Department (HOSPITAL_COMMUNITY)
Admission: EM | Admit: 2013-07-15 | Discharge: 2013-07-15 | Disposition: A | Discharge status: Home / Self Care | Payer: BC Managed Care – PPO | Attending: Emergency Medicine | Admitting: Emergency Medicine

## 2013-07-15 ENCOUNTER — Encounter (HOSPITAL_COMMUNITY): Payer: Self-pay | Admitting: Emergency Medicine

## 2013-07-15 DIAGNOSIS — F3289 Other specified depressive episodes: Secondary | ICD-10-CM | POA: Insufficient documentation

## 2013-07-15 DIAGNOSIS — F10920 Alcohol use, unspecified with intoxication, uncomplicated: Secondary | ICD-10-CM

## 2013-07-15 DIAGNOSIS — R4182 Altered mental status, unspecified: Secondary | ICD-10-CM | POA: Diagnosis present

## 2013-07-15 DIAGNOSIS — F17200 Nicotine dependence, unspecified, uncomplicated: Secondary | ICD-10-CM | POA: Insufficient documentation

## 2013-07-15 DIAGNOSIS — Z3202 Encounter for pregnancy test, result negative: Secondary | ICD-10-CM | POA: Diagnosis not present

## 2013-07-15 DIAGNOSIS — F1010 Alcohol abuse, uncomplicated: Secondary | ICD-10-CM | POA: Diagnosis not present

## 2013-07-15 DIAGNOSIS — F329 Major depressive disorder, single episode, unspecified: Secondary | ICD-10-CM | POA: Diagnosis not present

## 2013-07-15 DIAGNOSIS — F29 Unspecified psychosis not due to a substance or known physiological condition: Secondary | ICD-10-CM | POA: Diagnosis not present

## 2013-07-15 DIAGNOSIS — IMO0002 Reserved for concepts with insufficient information to code with codable children: Secondary | ICD-10-CM | POA: Insufficient documentation

## 2013-07-15 LAB — CBC
HCT: 42.5 % (ref 36.0–46.0)
Hemoglobin: 14.6 g/dL (ref 12.0–15.0)
MCH: 34.5 pg — ABNORMAL HIGH (ref 26.0–34.0)
MCHC: 34.4 g/dL (ref 30.0–36.0)
MCV: 100.5 fL — ABNORMAL HIGH (ref 78.0–100.0)
Platelets: 299 10*3/uL (ref 150–400)
RBC: 4.23 MIL/uL (ref 3.87–5.11)
RDW: 13.5 % (ref 11.5–15.5)
WBC: 3 10*3/uL — ABNORMAL LOW (ref 4.0–10.5)

## 2013-07-15 LAB — COMPREHENSIVE METABOLIC PANEL
ALT: 106 U/L — AB (ref 0–35)
ANION GAP: 30 — AB (ref 5–15)
AST: 205 U/L — ABNORMAL HIGH (ref 0–37)
Albumin: 4.3 g/dL (ref 3.5–5.2)
Alkaline Phosphatase: 126 U/L — ABNORMAL HIGH (ref 39–117)
BILIRUBIN TOTAL: 0.5 mg/dL (ref 0.3–1.2)
BUN: 4 mg/dL — AB (ref 6–23)
CHLORIDE: 93 meq/L — AB (ref 96–112)
CO2: 17 mEq/L — ABNORMAL LOW (ref 19–32)
Calcium: 9 mg/dL (ref 8.4–10.5)
Creatinine, Ser: 0.59 mg/dL (ref 0.50–1.10)
GFR calc non Af Amer: >90 mL/min (ref >90)
GLUCOSE: 72 mg/dL (ref 70–99)
Potassium: 3.5 mEq/L — ABNORMAL LOW (ref 3.7–5.3)
SODIUM: 140 meq/L (ref 137–147)
Total Protein: 7.8 g/dL (ref 6.0–8.3)

## 2013-07-15 LAB — ETHANOL: Alcohol, Ethyl (B): >498 mg/dL (ref 0–11)

## 2013-07-15 LAB — ACETAMINOPHEN LEVEL: Acetaminophen (Tylenol), Serum: <15 ug/mL (ref 10–30)

## 2013-07-15 LAB — RAPID URINE DRUG SCREEN, HOSP PERFORMED
Amphetamines: NOT DETECTED
BENZODIAZEPINES: NOT DETECTED
Barbiturates: NOT DETECTED
Cocaine: NOT DETECTED
Opiates: NOT DETECTED
TETRAHYDROCANNABINOL: NOT DETECTED

## 2013-07-15 LAB — SALICYLATE LEVEL: Salicylate Lvl: <2 mg/dL — ABNORMAL LOW (ref 2.8–20.0)

## 2013-07-15 LAB — POC URINE PREG, ED: Preg Test, Ur: NEGATIVE

## 2013-07-15 MED ORDER — SODIUM CHLORIDE 0.9 % IV BOLUS (SEPSIS)
1000.0000 mL | Freq: Once | INTRAVENOUS | Status: AC
  Administered 2013-07-15: 1000 mL via INTRAVENOUS

## 2013-07-15 MED ORDER — THIAMINE HCL 100 MG/ML IJ SOLN
100.0000 mg | Freq: Once | INTRAMUSCULAR | Status: AC
  Administered 2013-07-15: 100 mg via INTRAVENOUS
  Filled 2013-07-15: qty 2

## 2013-07-15 NOTE — Discharge Instructions (Signed)
Finding Treatment for Alcohol and Drug Addiction °It can be hard to find the right place to get professional treatment. Here are some important things to consider: °· There are different types of treatment to choose from. °· Some programs are live-in (residential) while others are not (outpatient). Sometimes a combination is offered. °· No single type of program is right for everyone. °· Most treatment programs involve a combination of education, counseling, and a 12-step, spiritually-based approach. °· There are non-spiritually based programs (not 12-step). °· Some treatment programs are government sponsored. They are geared for patients without private insurance. °· Treatment programs can vary in many respects such as: °· Cost and types of insurance accepted. °· Types of on-site medical services offered. °· Length of stay, setting, and size. °· Overall philosophy of treatment.  °A person may need specialized treatment or have needs not addressed by all programs. For example, adolescents need treatment appropriate for their age. Other people have secondary disorders that must be managed as well. Secondary conditions can include mental illness, such as depression or diabetes. Often, a period of detoxification from alcohol or drugs is needed. This requires medical supervision and not all programs offer this. °THINGS TO CONSIDER WHEN SELECTING A TREATMENT PROGRAM  °· Is the program certified by the appropriate government agency? Even private programs must be certified and employ certified professionals. °· Does the program accept your insurance? If not, can a payment plan be set up? °· Is the facility clean, organized, and well run? Do they allow you to speak with graduates who can share their treatment experience with you? Can you tour the facility? Can you meet with staff? °· Does the program meet the full range of individual needs? °· Does the treatment program address sexual orientation and physical disabilities?  Do they provide age, gender, and culturally appropriate treatment services? °· Is treatment available in languages other than English? °· Is long-term aftercare support or guidance encouraged and provided? °· Is assessment of an individual's treatment plan ongoing to ensure it meets changing needs? °· Does the program use strategies to encourage reluctant patients to remain in treatment long enough to increase the likelihood of success? °· Does the program offer counseling (individual or group) and other behavioral therapies? °· Does the program offer medicine as part of the treatment regimen, if needed? °· Is there ongoing monitoring of possible relapse? Is there a defined relapse prevention program? Are services or referrals offered to family members to ensure they understand addiction and the recovery process? This would help them support the recovering individual. °· Are 12-step meetings held at the center or is transport available for patients to attend outside meetings? °In countries outside of the [place], see local directories for contact information for services in your area. °Document Released: 11/18/2004 Document Revised: 03/14/2011 Document Reviewed: 05/31/2007 °[employer]® Patient Information ©2015 [employer]. This information is not intended to replace advice given to you by your health care provider. Make sure you discuss any questions you have with your health care provider. ° °Alcohol Intoxication °Alcohol intoxication occurs when the amount of alcohol that a person has consumed impairs his or her ability to mentally and physically function. Alcohol directly impairs the normal chemical activity of the brain. Drinking large amounts of alcohol can lead to changes in mental function and behavior, and it can cause many physical effects that can be harmful.  °Alcohol intoxication can range in severity from mild to very severe. Various factors can affect the level of   intoxication that occurs,  such as the person's age, gender, weight, frequency of alcohol consumption, and the presence of other medical conditions (such as diabetes, seizures, or heart conditions). Dangerous levels of alcohol intoxication may occur when people drink large amounts of alcohol in a short period (binge drinking). Alcohol can also be especially dangerous when combined with certain prescription medicines or "recreational" drugs. SIGNS AND SYMPTOMS Some common signs and symptoms of mild alcohol intoxication include:  Loss of coordination.  Changes in mood and behavior.  Impaired judgment.  Slurred speech. As alcohol intoxication progresses to more severe levels, other signs and symptoms will appear. These may include:  Vomiting.  Confusion and impaired memory.  Slowed breathing.  Seizures.  Loss of consciousness. DIAGNOSIS  Your health care provider will take a medical history and perform a physical exam. You will be asked about the amount and type of alcohol you have consumed. Blood tests will be done to measure the concentration of alcohol in your blood. In many places, your blood alcohol level must be lower than 80 mg/dL (1.61%0.08%) to legally drive. However, many dangerous effects of alcohol can occur at much lower levels.  TREATMENT  People with alcohol intoxication often do not require treatment. Most of the effects of alcohol intoxication are temporary, and they go away as the alcohol naturally leaves the body. Your health care provider will monitor your condition until you are stable enough to go home. Fluids are sometimes given through an IV access tube to help prevent dehydration.  HOME CARE INSTRUCTIONS  Do not drive after drinking alcohol.  Stay hydrated. Drink enough water and fluids to keep your urine clear or pale yellow. Avoid caffeine.   Only take over-the-counter or prescription medicines as directed by your health care provider.  SEEK MEDICAL CARE IF:   You have persistent  vomiting.   You do not feel better after a few days.  You have frequent alcohol intoxication. Your health care provider can help determine if you should see a substance use treatment counselor. SEEK IMMEDIATE MEDICAL CARE IF:   You become shaky or tremble when you try to stop drinking.   You shake uncontrollably (seizure).   You throw up (vomit) blood. This may be bright red or may look like black coffee grounds.   You have blood in your stool. This may be bright red or may appear as a black, tarry, bad smelling stool.   You become lightheaded or faint.  MAKE SURE YOU:   Understand these instructions.  Will watch your condition.  Will get help right away if you are not doing well or get worse. Document Released: 09/29/2004 Document Revised: 08/22/2012 Document Reviewed: 05/25/2012 [facility] Patient Information 2015 [employer] [place]. This information is not intended to replace advice given to you by your health care provider. Make sure you discuss any questions you have with your health care provider.
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
## 2013-12-08 ENCOUNTER — Ambulatory Visit (INDEPENDENT_AMBULATORY_CARE_PROVIDER_SITE_OTHER): Payer: BC Managed Care – PPO | Admitting: Family Medicine

## 2013-12-08 ENCOUNTER — Encounter (HOSPITAL_COMMUNITY): Payer: Self-pay | Admitting: Emergency Medicine

## 2013-12-08 ENCOUNTER — Inpatient Hospital Stay (HOSPITAL_COMMUNITY)
Admission: EM | Admit: 2013-12-08 | Discharge: 2013-12-09 | DRG: 641 | Disposition: A | Discharge status: Home / Self Care | Payer: BC Managed Care – PPO | Attending: Internal Medicine | Admitting: Internal Medicine

## 2013-12-08 VITALS — BP 104/72 | HR 97 | Temp 98.0°F | Resp 18 | Ht 65.5 in | Wt 108.8 lb

## 2013-12-08 DIAGNOSIS — Z0000 Encounter for general adult medical examination without abnormal findings: Secondary | ICD-10-CM

## 2013-12-08 DIAGNOSIS — E871 Hypo-osmolality and hyponatremia: Secondary | ICD-10-CM | POA: Diagnosis present

## 2013-12-08 DIAGNOSIS — E873 Alkalosis: Secondary | ICD-10-CM | POA: Diagnosis present

## 2013-12-08 DIAGNOSIS — E878 Other disorders of electrolyte and fluid balance, not elsewhere classified: Secondary | ICD-10-CM | POA: Diagnosis present

## 2013-12-08 DIAGNOSIS — F1010 Alcohol abuse, uncomplicated: Secondary | ICD-10-CM | POA: Diagnosis present

## 2013-12-08 DIAGNOSIS — Z681 Body mass index (BMI) 19 or less, adult: Secondary | ICD-10-CM

## 2013-12-08 DIAGNOSIS — Z113 Encounter for screening for infections with a predominantly sexual mode of transmission: Secondary | ICD-10-CM

## 2013-12-08 DIAGNOSIS — D61818 Other pancytopenia: Secondary | ICD-10-CM | POA: Diagnosis present

## 2013-12-08 DIAGNOSIS — Z720 Tobacco use: Secondary | ICD-10-CM | POA: Diagnosis present

## 2013-12-08 DIAGNOSIS — F1110 Opioid abuse, uncomplicated: Secondary | ICD-10-CM | POA: Diagnosis present

## 2013-12-08 DIAGNOSIS — E46 Unspecified protein-calorie malnutrition: Secondary | ICD-10-CM | POA: Diagnosis present

## 2013-12-08 DIAGNOSIS — G4700 Insomnia, unspecified: Secondary | ICD-10-CM

## 2013-12-08 DIAGNOSIS — E876 Hypokalemia: Principal | ICD-10-CM | POA: Diagnosis present

## 2013-12-08 DIAGNOSIS — Z13220 Encounter for screening for lipoid disorders: Secondary | ICD-10-CM

## 2013-12-08 DIAGNOSIS — F329 Major depressive disorder, single episode, unspecified: Secondary | ICD-10-CM | POA: Diagnosis present

## 2013-12-08 DIAGNOSIS — F17210 Nicotine dependence, cigarettes, uncomplicated: Secondary | ICD-10-CM | POA: Diagnosis present

## 2013-12-08 DIAGNOSIS — R636 Underweight: Secondary | ICD-10-CM | POA: Insufficient documentation

## 2013-12-08 DIAGNOSIS — Z131 Encounter for screening for diabetes mellitus: Secondary | ICD-10-CM

## 2013-12-08 HISTORY — DX: Alcohol dependence, uncomplicated: F10.20

## 2013-12-08 HISTORY — DX: Reserved for inherently not codable concepts without codable children: IMO0001

## 2013-12-08 LAB — CBC WITH DIFFERENTIAL/PLATELET
Basophils Absolute: 0 10*3/uL (ref 0.0–0.1)
Basophils Relative: 1 % (ref 0–1)
Eosinophils Absolute: 0.2 10*3/uL (ref 0.0–0.7)
Eosinophils Relative: 5 % (ref 0–5)
HEMATOCRIT: 36.6 % (ref 36.0–46.0)
Hemoglobin: 13.1 g/dL (ref 12.0–15.0)
LYMPHS PCT: 18 % (ref 12–46)
Lymphs Abs: 0.8 10*3/uL (ref 0.7–4.0)
MCH: 34.2 pg — ABNORMAL HIGH (ref 26.0–34.0)
MCHC: 35.8 g/dL (ref 30.0–36.0)
MCV: 95.6 fL (ref 78.0–100.0)
MONO ABS: 0.9 10*3/uL (ref 0.1–1.0)
MONOS PCT: 19 % — AB (ref 3–12)
MPV: 11.8 fL (ref 9.4–12.4)
Neutro Abs: 2.6 10*3/uL (ref 1.7–7.7)
Neutrophils Relative %: 57 % (ref 43–77)
Platelets: 92 10*3/uL — ABNORMAL LOW (ref 150–400)
RBC: 3.83 MIL/uL — AB (ref 3.87–5.11)
RDW: 13.2 % (ref 11.5–15.5)
WBC: 4.5 10*3/uL (ref 4.0–10.5)

## 2013-12-08 LAB — COMPREHENSIVE METABOLIC PANEL
ALBUMIN: 4.9 g/dL (ref 3.5–5.2)
ALBUMIN: 4.2 g/dL (ref 3.5–5.2)
ALT: 181 U/L — ABNORMAL HIGH (ref 0–35)
ALT: 182 U/L — ABNORMAL HIGH (ref 0–35)
AST: 260 U/L — AB (ref 0–37)
AST: 227 U/L — AB (ref 0–37)
Alkaline Phosphatase: 96 U/L (ref 39–117)
Alkaline Phosphatase: 118 U/L — ABNORMAL HIGH (ref 39–117)
Anion gap: 15 (ref 5–15)
BUN: 22 mg/dL (ref 6–23)
BUN: 26 mg/dL — ABNORMAL HIGH (ref 6–23)
CALCIUM: 10.1 mg/dL (ref 8.4–10.5)
CHLORIDE: 81 meq/L — AB (ref 96–112)
CHLORIDE: 80 meq/L — AB (ref 96–112)
CO2: 37 meq/L — AB (ref 19–32)
CO2: 34 mEq/L — ABNORMAL HIGH (ref 19–32)
CREATININE: 0.71 mg/dL (ref 0.50–1.10)
Calcium: 10.7 mg/dL — ABNORMAL HIGH (ref 8.4–10.5)
Creat: 0.72 mg/dL (ref 0.50–1.10)
GFR calc Af Amer: >90 mL/min (ref >90)
GFR calc non Af Amer: >90 mL/min (ref >90)
GLUCOSE: 86 mg/dL (ref 70–99)
Glucose, Bld: 99 mg/dL (ref 70–99)
POTASSIUM: 2.3 meq/L — AB (ref 3.5–5.3)
Potassium: 2.4 mEq/L — CL (ref 3.7–5.3)
Sodium: 131 mEq/L — ABNORMAL LOW (ref 135–145)
Sodium: 129 mEq/L — ABNORMAL LOW (ref 137–147)
Total Bilirubin: 1.1 mg/dL (ref 0.2–1.2)
Total Bilirubin: 0.5 mg/dL (ref 0.3–1.2)
Total Protein: 7.7 g/dL (ref 6.0–8.3)
Total Protein: 7.4 g/dL (ref 6.0–8.3)

## 2013-12-08 LAB — LIPID PANEL
CHOL/HDL RATIO: 3.5 ratio
Cholesterol: 283 mg/dL — ABNORMAL HIGH (ref 0–200)
HDL: 82 mg/dL (ref >39)
LDL Cholesterol: 190 mg/dL — ABNORMAL HIGH (ref 0–99)
Triglycerides: 57 mg/dL (ref <150)
VLDL: 11 mg/dL (ref 0–40)

## 2013-12-08 LAB — I-STAT CHEM 8, ED
BUN: 27 mg/dL — AB (ref 6–23)
Calcium, Ion: 1.17 mmol/L (ref 1.12–1.23)
Chloride: 81 mEq/L — ABNORMAL LOW (ref 96–112)
Creatinine, Ser: 0.8 mg/dL (ref 0.50–1.10)
GLUCOSE: 101 mg/dL — AB (ref 70–99)
HEMATOCRIT: 35 % — AB (ref 36.0–46.0)
Hemoglobin: 11.9 g/dL — ABNORMAL LOW (ref 12.0–15.0)
POTASSIUM: 2.2 meq/L — AB (ref 3.7–5.3)
Sodium: 129 mEq/L — ABNORMAL LOW (ref 137–147)
TCO2: 35 mmol/L (ref 0–100)

## 2013-12-08 LAB — ETHANOL

## 2013-12-08 LAB — POCT URINALYSIS DIPSTICK
Glucose, UA: NEGATIVE
LEUKOCYTES UA: NEGATIVE
Nitrite, UA: NEGATIVE
Spec Grav, UA: 1.01
Urobilinogen, UA: 1
pH, UA: 6.5

## 2013-12-08 LAB — MAGNESIUM: Magnesium: 1.4 mg/dL — ABNORMAL LOW (ref 1.5–2.5)

## 2013-12-08 LAB — TSH: TSH: 3.844 u[IU]/mL (ref 0.350–4.500)

## 2013-12-08 LAB — HIV ANTIBODY (ROUTINE TESTING W REFLEX): HIV: NONREACTIVE

## 2013-12-08 LAB — RPR

## 2013-12-08 MED ORDER — POTASSIUM CHLORIDE CRYS ER 20 MEQ PO TBCR
40.0000 meq | EXTENDED_RELEASE_TABLET | Freq: Once | ORAL | Status: AC
  Administered 2013-12-08: 40 meq via ORAL
  Filled 2013-12-08: qty 2

## 2013-12-08 MED ORDER — MAGNESIUM SULFATE 2 GM/50ML IV SOLN
2.0000 g | INTRAVENOUS | Status: AC
  Administered 2013-12-09: 2 g via INTRAVENOUS
  Filled 2013-12-08: qty 50

## 2013-12-08 MED ORDER — POTASSIUM CHLORIDE 10 MEQ/100ML IV SOLN
10.0000 meq | INTRAVENOUS | Status: AC
  Administered 2013-12-08 – 2013-12-09 (×4): 10 meq via INTRAVENOUS
  Filled 2013-12-08 (×3): qty 100

## 2013-12-08 MED ORDER — TRAZODONE HCL 50 MG PO TABS: 50.0000 mg | ORAL_TABLET | Freq: Every evening | ORAL | Status: DC | PRN

## 2013-12-08 MED ORDER — SODIUM CHLORIDE 0.9 % IV BOLUS (SEPSIS)
1000.0000 mL | Freq: Once | INTRAVENOUS | Status: AC
  Administered 2013-12-08: 1000 mL via INTRAVENOUS

## 2013-12-08 NOTE — Progress Notes (Addendum)
Subjective:  This chart was scribed for Alexandra SimmerKristi Alean Kromer, MD by Alexandra Osborne, ED Scribe at [facility].The patient was seen in exam room 11 and the patient's care was started at 12:03 PM.   Patient ID: Alexandra BridgemanJennifer K Osborne, female    DOB: 03-15-1985, 28 y.o.   MRN: [id number]  12/08/2013  Annual Exam and Medication Refill  HPI  HPI Comments: Alexandra Osborne is a 28 y.o. female with a history of alcohol abuse who [facility] for an annual exam and a medication refill for temazepam.  Pt does not know when her last  physical exam or PAP smear was. Pt states she has not had a mammogram and has not need one.  Pt has not seen a dentist or a eye doctor.  She has gotten a tetanus shot recently, pt has not gotten the flu shot and denies one today.  Pt has a history of depression and is not seeing anyone but states she is fine but is stressed because of her job.  No surgery and  no recent hospitalizations.  Her mother [id number], father [id number] and sister have no medical issues. Pt has no brothers.  Pt is single and not dating and has no children. She lives alone, and works as an Glass blower/designeraccount executive. Pt occasionally drinks and smokes, she does not use any illegal drugs. She denies daily alcohol intake currently.  She participates in minor exercise when she walks her dog. She wears her seatbelt every time she drives her car. Pt has no guns at home.  Pt is otherwise healthy. Review of Systems  Constitutional: Negative for fever, chills, diaphoresis, activity change, appetite change, fatigue and unexpected weight change.  HENT: Negative for congestion, dental problem, drooling, ear discharge, ear pain, facial swelling, hearing loss, mouth sores, nosebleeds, postnasal drip, rhinorrhea, sinus pressure, sneezing, sore throat, tinnitus, trouble swallowing and voice change.   Eyes: Negative for photophobia, pain, discharge, redness, itching and visual disturbance.  Respiratory: Negative for apnea,  cough, choking, chest tightness, shortness of breath, wheezing and stridor.   Cardiovascular: Negative for chest pain, palpitations and leg swelling.  Gastrointestinal: Negative for nausea, vomiting, abdominal pain, diarrhea, constipation, blood in stool, abdominal distention, anal bleeding and rectal pain.  Endocrine: Negative for cold intolerance, heat intolerance, polydipsia, polyphagia and polyuria.  Genitourinary: Negative for dysuria, urgency, frequency, hematuria, flank pain, decreased urine volume, vaginal bleeding, vaginal discharge, enuresis, difficulty urinating, genital sores, vaginal pain, menstrual problem, pelvic pain and dyspareunia.  Musculoskeletal: Negative for myalgias, back pain, joint swelling, arthralgias, gait problem, neck pain and neck stiffness.  Skin: Negative for color change, pallor, rash and wound.  Allergic/Immunologic: Negative for environmental allergies, food allergies and immunocompromised state.  Neurological: Negative for dizziness, tremors, seizures, syncope, facial asymmetry, speech difficulty, weakness, light-headedness, numbness and headaches.  Hematological: Negative for adenopathy. Does not bruise/bleed easily.  Psychiatric/Behavioral: Positive for sleep disturbance. Negative for suicidal ideas, hallucinations, behavioral problems, confusion, self-injury, dysphoric mood, decreased concentration and agitation. The patient is not nervous/anxious and is not hyperactive.   All other systems reviewed and are negative.  Past Medical History  Diagnosis Date  . Depression   . Substance abuse    History reviewed. No pertinent past surgical history. No Known Allergies Current Outpatient Prescriptions  Medication Sig Dispense Refill  . temazepam (RESTORIL) 15 MG capsule Take 15 mg by mouth at bedtime as needed for sleep.    . traZODone (DESYREL) 50 MG tablet Take 1-2 tablets (50-100 mg  total) by mouth at bedtime as needed for sleep. 60 tablet 5   No current  facility-administered medications for this visit.      Objective:    BP 104/72 mmHg  Pulse 97  Temp(Src) 98 F (36.7 C) (Oral)  Resp 18  Ht 5' 5.5" (1.664 m)  Wt 108 lb 12.8 oz (49.351 kg)  BMI 17.82 kg/m2  SpO2 100%  LMP 12/04/2013 Physical Exam  Constitutional: She is oriented to person, place, and time. She appears well-developed and well-nourished. No distress.  HENT:  Head: Normocephalic and atraumatic.  Right Ear: External ear normal.  Left Ear: External ear normal.  Nose: Nose normal.  Mouth/Throat: Oropharynx is clear and moist. No oropharyngeal exudate.  Eyes: Conjunctivae and EOM are normal. Pupils are equal, round, and reactive to light.  Neck: Normal range of motion and full passive range of motion without pain. Neck supple. No JVD present. Carotid bruit is not present. No thyromegaly present.  Cardiovascular: Normal rate, regular rhythm and normal heart sounds.  Exam reveals no gallop and no friction rub.   No murmur heard. Pulmonary/Chest: Effort normal and breath sounds normal. She has no wheezes. She has no rales.  Abdominal: Soft. Bowel sounds are normal. She exhibits no distension and no mass. There is no tenderness. There is no rebound and no guarding.  Genitourinary:  Pt declined Genitourinary exam  Musculoskeletal: Normal range of motion.       Right shoulder: Normal.       Left shoulder: Normal.       Cervical back: Normal.  Lymphadenopathy:    She has no cervical adenopathy.  Neurological: She is alert and oriented to person, place, and time. She has normal reflexes. No cranial nerve deficit. She exhibits normal muscle tone. Coordination normal.  Skin: Skin is warm and dry. No rash noted. She is not diaphoretic. No erythema. No pallor.     Large 4 cm x 5 cm ecchymoses on the right lateral breast and 6cm x 7 cm ecchymoses left lateral arm.   Psychiatric: She has a normal mood and affect. Her behavior is normal. Judgment and thought content normal.    Flat.  Nursing note and vitals reviewed.  Results for orders placed or performed during the hospital encounter of 07/15/13  CBC  Result Value Ref Range   WBC 3.0 (L) 4.0 - 10.5 K/uL   RBC 4.23 3.87 - 5.11 MIL/uL   Hemoglobin 14.6 12.0 - 15.0 g/dL   HCT 16.1 09.6 - 04.5 %   MCV 100.5 (H) 78.0 - 100.0 fL   MCH 34.5 (H) 26.0 - 34.0 pg   MCHC 34.4 30.0 - 36.0 g/dL   RDW 40.9 81.1 - 91.4 %   Platelets 299 150 - 400 K/uL  Comprehensive metabolic panel  Result Value Ref Range   Sodium 140 137 - 147 mEq/L   Potassium 3.5 (L) 3.7 - 5.3 mEq/L   Chloride 93 (L) 96 - 112 mEq/L   CO2 17 (L) 19 - 32 mEq/L   Glucose, Bld 72 70 - 99 mg/dL   BUN 4 (L) 6 - 23 mg/dL   Creatinine, Ser 7.82 0.50 - 1.10 mg/dL   Calcium 9.0 8.4 - 95.6 mg/dL   Total Protein 7.8 6.0 - 8.3 g/dL   Albumin 4.3 3.5 - 5.2 g/dL   AST 213 (H) 0 - 37 U/L   ALT 106 (H) 0 - 35 U/L   Alkaline Phosphatase 126 (H) 39 - 117 U/L   Total  Bilirubin 0.5 0.3 - 1.2 mg/dL   GFR calc non Af Amer >90 >90 mL/min   GFR calc Af Amer >90 >90 mL/min   Anion gap 30 (H) 5 - 15  Ethanol (ETOH)  Result Value Ref Range   Alcohol, Ethyl (B) >498 (HH) 0 - 11 mg/dL  Acetaminophen level  Result Value Ref Range   Acetaminophen (Tylenol), Serum <15.0 10 - 30 ug/mL  Salicylate level  Result Value Ref Range   Salicylate Lvl <2.0 (L) 2.8 - 20.0 mg/dL  Urine rapid drug screen (hosp performed)  Result Value Ref Range   Opiates NONE DETECTED NONE DETECTED   Cocaine NONE DETECTED NONE DETECTED   Benzodiazepines NONE DETECTED NONE DETECTED   Amphetamines NONE DETECTED NONE DETECTED   Tetrahydrocannabinol NONE DETECTED NONE DETECTED   Barbiturates NONE DETECTED NONE DETECTED  POC Urine Pregnancy, ED (if pre-menopausal female) - NOT at [facility]  Result Value Ref Range   Preg Test, Ur NEGATIVE NEGATIVE      Assessment & Plan:   1. Insomnia   2. Annual physical exam   3. Screening for diabetes mellitus   4. Screening, lipid   5. Screening for STD  (sexually transmitted disease)   6. Underweight     1. Complete Physical Examination:  Anticipatory guidance --- weight gain, exercise.  Pt refused pap smear today.  Denies regular alcohol use or misuse today; does not want to discuss during visit.  Immunizations reviewed; declined flu vaccine.  Menses are regular. 2.  Screening STDs: obtain GC/Chlam, RPR, HIV. 3.  Underweight: denies attempts at intentional weight loss; reports that weight fluctuates.  Recommend follow-up in three months for weight check. 4.  Alcohol abuse: ongoing use of alcohol; previous multiple admissions for alcoholism; very brief with discussion and will not discuss in detail.  Denies daily drinking but has been a chronic binge drinker by report.  Denies needing help at this time. 5.  Insomnia: uncontrolled; rx for Trazodone provided; will not write benzo with alcoholism.  Explained reasoning to patient.  Recommend follow-up in three months with Dr. Merla Richesoolittle for follow-up.   6. History of depression: denies depression at this time; emotionally good yet affect very flat.  Meds ordered this encounter  Medications  . traZODone (DESYREL) 50 MG tablet    Sig: Take 1-2 tablets (50-100 mg total) by mouth at bedtime as needed for sleep.    Dispense:  60 tablet    Refill:  5    No Follow-up on file.   I personally performed the services described in this documentation, which was scribed in my presence. The recorded information has been reviewed and considered.  Alexandra SimmerKristi Dameshia Seybold, M.D.  [facility]   [address] [place], [place]  [id number] [phone] phone [phone] fax

## 2013-12-08 NOTE — Patient Instructions (Addendum)
1.  RECOMMEND FOLLOW-UP WITH DR. Merla RichesOLITTLE IN UPCOMING 3 MONTHS FOR FOLLOW-UP OF INSOMNIA AND WEIGHT CHECK.   Keeping You Healthy  Get These Tests 1. Blood Pressure- Have your blood pressure checked once a year by your health care provider.  Normal blood pressure is 120/80. 2. Weight- Have your body mass index (BMI) calculated to screen for obesity.  BMI is measure of body fat based on height and weight.  You can also calculate your own BMI at https://www.west-esparza.com/www.nhlbisupport.com/bmi/. 3. Cholesterol- Have your cholesterol checked every 5 years starting at age 820 then yearly starting at age 30. 4. Chlamydia, HIV, and other sexually transmitted diseases- Get screened every year until age 30, then within three months of each new sexual provider. 5. Pap Smear- Every 1-3 years; discuss with your health care provider. 6. Mammogram- Every year starting at age 30  Take these medicines  Calcium with Vitamin D-Your body needs 1200 mg of Calcium each day and [phone] IU of Vitamin D daily.  Your body can only absorb 500 mg of Calcium at a time so Calcium must be taken in 2 or 3 divided doses throughout the day.  Multivitamin with folic acid- Once daily if it is possible for you to become pregnant.  Get these Immunizations  Gardasil-Series of three doses; prevents HPV related illness such as genital warts and cervical cancer.  Menactra-Single dose; prevents meningitis.  Tetanus shot- Every 10 years.  Flu shot-Every year.  Take these steps 1. Do not smoke-Your healthcare provider can help you quit.  For tips on how to quit go to www.smokefree.gov or call 1-800 QUITNOW. 2. Be physically active- Exercise 5 days a week for at least 30 minutes.  If you are not already physically active, start slow and gradually work up to 30 minutes of moderate physical activity.  Examples of moderate activity include walking briskly, dancing, swimming, bicycling, etc. 3. Breast Cancer- A self breast exam every month is important  for early detection of breast cancer.  For more information and instruction on self breast exams, ask your healthcare provider or [place].eswww.womenshealth.gov/faq/breast-self-exam.cfm. 4. Eat a healthy diet- Eat a variety of healthy foods such as fruits, vegetables, whole grains, low fat milk, low fat cheeses, yogurt, lean meats, poultry and fish, beans, nuts, tofu, etc.  For more information go to www. Thenutritionsource.org 5. Drink alcohol in moderation- Limit alcohol intake to one drink or less per day. Never drink and drive. 6. Depression- Your emotional health is as important as your physical health.  If you're feeling down or losing interest in things you normally enjoy please talk to your healthcare provider about being screened for depression. 7. Dental visit- Brush and floss your teeth twice daily; visit your dentist twice a year. 8. Eye doctor- Get an eye exam at least every 2 years. 9. Helmet use- Always wear a helmet when riding a bicycle, motorcycle, rollerblading or skateboarding. 10. Safe sex- If you may be exposed to sexually transmitted infections, use a condom. 11. Seat belts- Seat belts can save your live; always wear one. 12. Smoke/Carbon Monoxide detectors- These detectors need to be installed on the appropriate level of your home. Replace batteries at least once a year. 13. Skin cancer- When out in the sun please cover up and use sunscreen 15 SPF or higher. 14. Violence- If anyone is threatening or hurting you, please tell your healthcare provider.

## 2013-12-08 NOTE — ED Notes (Signed)
Bed: [id number] Expected date:  Expected time:  Means of arrival:  Comments: tr1
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
## 2013-12-08 NOTE — ED Provider Notes (Signed)
CSN: [id number]     Arrival date & time 12/08/13  2157 History   First MD Initiated Contact with Patient 12/08/13 2237     Chief Complaint  Patient presents with  . Hypokalemia      (Consider location/radiation/quality/duration/timing/severity/associated sxs/prior Treatment) HPI Comments:  Patient presents to the emergency department with chief complaints of abnormal lab value. She states that she was seen at urgent care today for a wellness checkup and medication refill. She states that she was told that her potassium level was 2.3. She states that she was told to come to the emergency department. Patient denies any associated chest pain, shortness breath, nausea, vomiting, diarrhea, dizziness, or lightheadedness. However, she states the at the beginning of last week she did have nausea, vomiting, diarrhea. Additionally, patient's mother states that she is a severe alcoholic. Patient denies any symptoms at this time.  The history is provided by the patient. No language interpreter was used.    Past Medical History  Diagnosis Date  . Depression   . Substance abuse    History reviewed. No pertinent past surgical history. Family History  Problem Relation Age of Onset  . Stroke Maternal Grandfather   . Cancer Maternal Grandmother   . Diabetes Maternal Grandmother   . Stroke Other   . Cancer Other   . Hypertension Other    History  Substance Use Topics  . Smoking status: Current Some Day Smoker    Types: Cigarettes  . Smokeless tobacco: Never Used  . Alcohol Use: 0.0 oz/week     Comment: substance (alcohol) abuse   OB History    No data available     Review of Systems  Constitutional: Negative for fever and chills.  Respiratory: Negative for shortness of breath.   Cardiovascular: Negative for chest pain.  Gastrointestinal: Negative for nausea, vomiting, diarrhea and constipation.  Genitourinary: Negative for dysuria.  All other systems reviewed and are  negative.     Allergies  Review of patient's allergies indicates no known allergies.  Home Medications   Prior to Admission medications   Medication Sig Start Date End Date Taking? Authorizing Provider  Multiple Vitamin (MULTIVITAMIN WITH MINERALS) TABS tablet Take 1 tablet by mouth daily.   Yes Historical Provider, MD  naproxen sodium (ANAPROX) 220 MG tablet Take 440 mg by mouth 2 (two) times daily as needed (pain).   Yes Historical Provider, MD  temazepam (RESTORIL) 15 MG capsule Take 15 mg by mouth at bedtime as needed for sleep.    Historical Provider, MD  traZODone (DESYREL) 50 MG tablet Take 1-2 tablets (50-100 mg total) by mouth at bedtime as needed for sleep. 12/08/13   Ethelda ChickKristi M Smith, MD   BP 109/72 mmHg  Pulse 92  Temp(Src) 98.1 F (36.7 C) (Oral)  Resp 19  Ht 5\' 6"  (1.676 m)  Wt 109 lb (49.442 kg)  BMI 17.60 kg/m2  SpO2 100%  LMP 12/04/2013 Physical Exam  Constitutional: She is oriented to person, place, and time. She appears well-developed and well-nourished.  HENT:  Head: Normocephalic and atraumatic.  Eyes: Conjunctivae and EOM are normal. Pupils are equal, round, and reactive to light.  Neck: Normal range of motion. Neck supple.  Cardiovascular: Normal rate and regular rhythm.  Exam reveals no gallop and no friction rub.   No murmur heard. Pulmonary/Chest: Effort normal and breath sounds normal. No respiratory distress. She has no wheezes. She has no rales. She exhibits no tenderness.  Abdominal: Soft. Bowel sounds are normal. She exhibits  no distension and no mass. There is no tenderness. There is no rebound and no guarding.  Musculoskeletal: Normal range of motion. She exhibits no edema or tenderness.  Neurological: She is alert and oriented to person, place, and time.  Skin: Skin is warm and dry.  Psychiatric: She has a normal mood and affect. Her behavior is normal. Judgment and thought content normal.  Nursing note and vitals reviewed.   ED Course   Procedures (including critical care time) Results for orders placed or performed during the hospital encounter of 12/08/13  CBC with Differential  Result Value Ref Range   WBC 3.8 (L) 4.0 - 10.5 K/uL   RBC 3.29 (L) 3.87 - 5.11 MIL/uL   Hemoglobin 11.3 (L) 12.0 - 15.0 g/dL   HCT 16.132.0 (L) 09.636.0 - 04.546.0 %   MCV 97.3 78.0 - 100.0 fL   MCH 34.3 (H) 26.0 - 34.0 pg   MCHC 35.3 30.0 - 36.0 g/dL   RDW 40.912.5 81.111.5 - 91.415.5 %   Platelets 88 (L) 150 - 400 K/uL   Neutrophils Relative % 48 43 - 77 %   Lymphocytes Relative 22 12 - 46 %   Monocytes Relative 26 (H) 3 - 12 %   Eosinophils Relative 4 0 - 5 %   Basophils Relative 0 0 - 1 %   Neutro Abs 1.8 1.7 - 7.7 K/uL   Lymphs Abs 0.8 0.7 - 4.0 K/uL   Monocytes Absolute 1.0 0.1 - 1.0 K/uL   Eosinophils Absolute 0.2 0.0 - 0.7 K/uL   Basophils Absolute 0.0 0.0 - 0.1 K/uL   RBC Morphology TEARDROP CELLS   Comprehensive metabolic panel  Result Value Ref Range   Sodium 129 (L) 137 - 147 mEq/L   Potassium 2.4 (LL) 3.7 - 5.3 mEq/L   Chloride 80 (L) 96 - 112 mEq/L   CO2 34 (H) 19 - 32 mEq/L   Glucose, Bld 99 70 - 99 mg/dL   BUN 26 (H) 6 - 23 mg/dL   Creatinine, Ser 7.820.71 0.50 - 1.10 mg/dL   Calcium 95.610.7 (H) 8.4 - 10.5 mg/dL   Total Protein 7.4 6.0 - 8.3 g/dL   Albumin 4.2 3.5 - 5.2 g/dL   AST 213227 (H) 0 - 37 U/L   ALT 182 (H) 0 - 35 U/L   Alkaline Phosphatase 118 (H) 39 - 117 U/L   Total Bilirubin 0.5 0.3 - 1.2 mg/dL   GFR calc non Af Amer >90 >90 mL/min   GFR calc Af Amer >90 >90 mL/min   Anion gap 15 5 - 15  Ethanol  Result Value Ref Range   Alcohol, Ethyl (B) <11 0 - 11 mg/dL  Magnesium  Result Value Ref Range   Magnesium 1.4 (L) 1.5 - 2.5 mg/dL  I-stat chem 8, ed  Result Value Ref Range   Sodium 129 (L) 137 - 147 mEq/L   Potassium 2.2 (LL) 3.7 - 5.3 mEq/L   Chloride 81 (L) 96 - 112 mEq/L   BUN 27 (H) 6 - 23 mg/dL   Creatinine, Ser 0.860.80 0.50 - 1.10 mg/dL   Glucose, Bld 578101 (H) 70 - 99 mg/dL   Calcium, Ion 4.691.17 6.291.12 - 1.23 mmol/L   TCO2  35 0 - 100 mmol/L   Hemoglobin 11.9 (L) 12.0 - 15.0 g/dL   HCT 52.835.0 (L) 41.336.0 - 24.446.0 %   Comment NOTIFIED PHYSICIAN   POC occult blood, ED  Result Value Ref Range   Fecal Occult Bld NEGATIVE NEGATIVE  No results found.   Imaging Review No results found.   EKG Interpretation None     ED ECG REPORT  I personally interpreted this EKG   Date: 12/09/2013   Rate: 86  Rhythm: normal sinus rhythm  QRS Axis: normal  Intervals: normal  ST/T Wave abnormalities: normal  Conduction Disutrbances:none  Narrative Interpretation:   Old EKG Reviewed: none available   MDM   Final diagnoses:  Hypokalemia  Pancytopenia  Hyponatremia  Hypochloremic alkalosis  Metabolic alkalosis   Patient with reported hypokalemia.  Will recheck labs and plan to supplement.  Labs, EKG pending.  Medications  potassium chloride 10 mEq in 100 mL IVPB (0 mEq Intravenous Stopped 12/09/13 0027)  magnesium sulfate IVPB 2 g 50 mL (2 g Intravenous New Bag/Given 12/09/13 0037)  sodium chloride 0.9 % bolus 1,000 mL (1,000 mLs Intravenous New Bag/Given 12/08/13 2309)  potassium chloride SA (K-DUR,KLOR-CON) CR tablet 40 mEq (40 mEq Oral Given 12/08/13 2327)   Will admit to medicine given hyponatremia, severe hypokalemia.  Discussed with Dr. Norlene Campbell, who agrees with the plan.   1:02 AM Patient states that she has a job interview in the morning.  She would like to have the hospital call to confirm with interviewer that the patient is in the hospital.  Information relayed to nursing staff who will complete necessary paperwork and notify interviewer in the morning.  Roxy Horseman, PA-C 12/09/13 0043  Roxy Horseman, PA-C 12/09/13 1324  Olivia Mackie, MD 12/09/13 [phone]

## 2013-12-09 ENCOUNTER — Encounter (HOSPITAL_COMMUNITY): Payer: Self-pay | Admitting: Internal Medicine

## 2013-12-09 DIAGNOSIS — E871 Hypo-osmolality and hyponatremia: Secondary | ICD-10-CM | POA: Diagnosis present

## 2013-12-09 DIAGNOSIS — E876 Hypokalemia: Secondary | ICD-10-CM | POA: Diagnosis present

## 2013-12-09 DIAGNOSIS — F1010 Alcohol abuse, uncomplicated: Secondary | ICD-10-CM | POA: Diagnosis present

## 2013-12-09 DIAGNOSIS — F1110 Opioid abuse, uncomplicated: Secondary | ICD-10-CM | POA: Diagnosis present

## 2013-12-09 DIAGNOSIS — E46 Unspecified protein-calorie malnutrition: Secondary | ICD-10-CM | POA: Diagnosis present

## 2013-12-09 DIAGNOSIS — D61818 Other pancytopenia: Secondary | ICD-10-CM | POA: Diagnosis present

## 2013-12-09 DIAGNOSIS — Z720 Tobacco use: Secondary | ICD-10-CM | POA: Diagnosis present

## 2013-12-09 DIAGNOSIS — Z681 Body mass index (BMI) 19 or less, adult: Secondary | ICD-10-CM | POA: Diagnosis not present

## 2013-12-09 DIAGNOSIS — F17210 Nicotine dependence, cigarettes, uncomplicated: Secondary | ICD-10-CM | POA: Diagnosis present

## 2013-12-09 DIAGNOSIS — E878 Other disorders of electrolyte and fluid balance, not elsewhere classified: Secondary | ICD-10-CM | POA: Diagnosis present

## 2013-12-09 DIAGNOSIS — F329 Major depressive disorder, single episode, unspecified: Secondary | ICD-10-CM | POA: Diagnosis present

## 2013-12-09 DIAGNOSIS — E873 Alkalosis: Secondary | ICD-10-CM | POA: Diagnosis present

## 2013-12-09 LAB — BASIC METABOLIC PANEL
Anion gap: 15 (ref 5–15)
BUN: 19 mg/dL (ref 6–23)
CO2: 29 meq/L (ref 19–32)
Calcium: 9.1 mg/dL (ref 8.4–10.5)
Chloride: 93 mEq/L — ABNORMAL LOW (ref 96–112)
Creatinine, Ser: 0.59 mg/dL (ref 0.50–1.10)
GFR calc Af Amer: >90 mL/min (ref >90)
GFR calc non Af Amer: >90 mL/min (ref >90)
GLUCOSE: 109 mg/dL — AB (ref 70–99)
POTASSIUM: 3.2 meq/L — AB (ref 3.7–5.3)
SODIUM: 137 meq/L (ref 137–147)

## 2013-12-09 LAB — CBC
HEMATOCRIT: 28 % — AB (ref 36.0–46.0)
Hemoglobin: 9.9 g/dL — ABNORMAL LOW (ref 12.0–15.0)
MCH: 34.7 pg — ABNORMAL HIGH (ref 26.0–34.0)
MCHC: 35.4 g/dL (ref 30.0–36.0)
MCV: 98.2 fL (ref 78.0–100.0)
PLATELETS: 88 10*3/uL — AB (ref 150–400)
RBC: 2.85 MIL/uL — ABNORMAL LOW (ref 3.87–5.11)
RDW: 12.6 % (ref 11.5–15.5)
WBC: 3.6 10*3/uL — AB (ref 4.0–10.5)

## 2013-12-09 LAB — CBC WITH DIFFERENTIAL/PLATELET
Basophils Absolute: 0 10*3/uL (ref 0.0–0.1)
Basophils Relative: 0 % (ref 0–1)
Eosinophils Absolute: 0.2 10*3/uL (ref 0.0–0.7)
Eosinophils Relative: 4 % (ref 0–5)
HCT: 32 % — ABNORMAL LOW (ref 36.0–46.0)
Hemoglobin: 11.3 g/dL — ABNORMAL LOW (ref 12.0–15.0)
LYMPHS ABS: 0.8 10*3/uL (ref 0.7–4.0)
LYMPHS PCT: 22 % (ref 12–46)
MCH: 34.3 pg — ABNORMAL HIGH (ref 26.0–34.0)
MCHC: 35.3 g/dL (ref 30.0–36.0)
MCV: 97.3 fL (ref 78.0–100.0)
MONOS PCT: 26 % — AB (ref 3–12)
Monocytes Absolute: 1 10*3/uL (ref 0.1–1.0)
Neutro Abs: 1.8 10*3/uL (ref 1.7–7.7)
Neutrophils Relative %: 48 % (ref 43–77)
PLATELETS: 88 10*3/uL — AB (ref 150–400)
RBC: 3.29 MIL/uL — AB (ref 3.87–5.11)
RDW: 12.5 % (ref 11.5–15.5)
WBC: 3.8 10*3/uL — ABNORMAL LOW (ref 4.0–10.5)

## 2013-12-09 LAB — HEMOGLOBIN A1C
HEMOGLOBIN A1C: 5 % (ref <5.7)
MEAN PLASMA GLUCOSE: 97 mg/dL (ref <117)

## 2013-12-09 LAB — MRSA PCR SCREENING: MRSA by PCR: NEGATIVE

## 2013-12-09 LAB — POC OCCULT BLOOD, ED: Fecal Occult Bld: NEGATIVE

## 2013-12-09 LAB — MAGNESIUM: Magnesium: 2.2 mg/dL (ref 1.5–2.5)

## 2013-12-09 LAB — GC/CHLAMYDIA PROBE AMP
CT Probe RNA: NEGATIVE
GC Probe RNA: NEGATIVE

## 2013-12-09 MED ORDER — POTASSIUM CHLORIDE ER 20 MEQ PO TBCR: 20.0000 meq | EXTENDED_RELEASE_TABLET | Freq: Every day | ORAL | Status: DC

## 2013-12-09 MED ORDER — POTASSIUM CHLORIDE IN NACL 40-0.9 MEQ/L-% IV SOLN
INTRAVENOUS | Status: DC
  Administered 2013-12-09: 100 mL/h via INTRAVENOUS
  Filled 2013-12-09 (×2): qty 1000

## 2013-12-09 MED ORDER — ACETAMINOPHEN 325 MG PO TABS: 650.0000 mg | ORAL_TABLET | Freq: Four times a day (QID) | ORAL | Status: DC | PRN

## 2013-12-09 MED ORDER — ACETAMINOPHEN 650 MG RE SUPP: 650.0000 mg | Freq: Four times a day (QID) | RECTAL | Status: DC | PRN

## 2013-12-09 MED ORDER — NICOTINE 7 MG/24HR TD PT24
7.0000 mg | MEDICATED_PATCH | Freq: Every day | TRANSDERMAL | Status: DC
  Filled 2013-12-09: qty 1

## 2013-12-09 MED ORDER — FOLIC ACID 1 MG PO TABS
1.0000 mg | ORAL_TABLET | Freq: Every day | ORAL | Status: DC
  Administered 2013-12-09: 1 mg via ORAL
  Filled 2013-12-09: qty 1

## 2013-12-09 MED ORDER — VITAMIN B-1 100 MG PO TABS
100.0000 mg | ORAL_TABLET | Freq: Every day | ORAL | Status: DC
  Administered 2013-12-09: 100 mg via ORAL
  Filled 2013-12-09: qty 1

## 2013-12-09 MED ORDER — LORAZEPAM 1 MG PO TABS: 1.0000 mg | ORAL_TABLET | Freq: Four times a day (QID) | ORAL | Status: DC | PRN

## 2013-12-09 MED ORDER — ENOXAPARIN SODIUM 40 MG/0.4ML ~~LOC~~ SOLN: 40.0000 mg | SUBCUTANEOUS | Status: DC

## 2013-12-09 MED ORDER — ALUM & MAG HYDROXIDE-SIMETH 200-200-20 MG/5ML PO SUSP: 30.0000 mL | Freq: Four times a day (QID) | ORAL | Status: DC | PRN

## 2013-12-09 MED ORDER — LORAZEPAM 2 MG/ML IJ SOLN: 1.0000 mg | Freq: Four times a day (QID) | INTRAMUSCULAR | Status: DC | PRN

## 2013-12-09 MED ORDER — TRAZODONE HCL 50 MG PO TABS: 50.0000 mg | ORAL_TABLET | Freq: Every evening | ORAL | Status: DC | PRN

## 2013-12-09 MED ORDER — LORAZEPAM 1 MG PO TABS
0.0000 mg | ORAL_TABLET | Freq: Two times a day (BID) | ORAL | Status: DC
Start: 2013-12-11 — End: 2013-12-09

## 2013-12-09 MED ORDER — ONDANSETRON HCL 4 MG/2ML IJ SOLN: 4.0000 mg | Freq: Four times a day (QID) | INTRAMUSCULAR | Status: DC | PRN

## 2013-12-09 MED ORDER — OXYCODONE HCL 5 MG PO TABS: 5.0000 mg | ORAL_TABLET | ORAL | Status: DC | PRN

## 2013-12-09 MED ORDER — HYDROMORPHONE HCL 1 MG/ML IJ SOLN: 0.5000 mg | INTRAMUSCULAR | Status: DC | PRN

## 2013-12-09 MED ORDER — ADULT MULTIVITAMIN W/MINERALS CH
1.0000 | ORAL_TABLET | Freq: Every day | ORAL | Status: DC
  Administered 2013-12-09: 1 via ORAL
  Filled 2013-12-09: qty 1

## 2013-12-09 MED ORDER — LORAZEPAM 1 MG PO TABS
0.0000 mg | ORAL_TABLET | Freq: Four times a day (QID) | ORAL | Status: DC
Start: 2013-12-09 — End: 2013-12-09
  Administered 2013-12-09 (×2): 1 mg via ORAL
  Filled 2013-12-09 (×2): qty 1

## 2013-12-09 MED ORDER — ONDANSETRON HCL 4 MG PO TABS: 4.0000 mg | ORAL_TABLET | Freq: Four times a day (QID) | ORAL | Status: DC | PRN

## 2013-12-09 NOTE — Progress Notes (Signed)
CARE MANAGEMENT NOTE 12/09/2013  Patient:  Alexandra BridgemanKELLY,Alexandra Osborne   Account Number:  [id number]  Date Initiated:  12/09/2013  Documentation initiated by:  DAVIS,RHONDA  Subjective/Objective Assessment:   pt with hx of etoh abuse admitted due to hypopotassemia and hyponatremia     Action/Plan:   home when stable   Anticipated DC Date:  12/12/2013   Anticipated DC Plan:  HOME/SELF CARE  In-house referral  NA      DC Planning Services  CM consult      PAC Choice  NA   Choice offered to / List presented to:  NA   DME arranged  NA      DME agency  NA     HH arranged  NA      HH agency  NA   Status of service:  In process, will continue to follow Medicare Important Message given?   (If response is "NO", the following Medicare IM given date fields will be blank) Date Medicare IM given:   Medicare IM given by:   Date Additional Medicare IM given:   Additional Medicare IM given by:    Discharge Disposition:    Per UR Regulation:  Reviewed for med. necessity/level of care/duration of stay  If discussed at Long Length of Stay Meetings, dates discussed:    Comments:  [id number]/Rhonda Earlene Plateravis, RN, BSN, CCM: Chart review performed for dc needs. Discharge needs at time of review:  none Chart note for progression of stay: Alexandra BridgemanJennifer Osborne Osborne is a 28 y.o. female with a history of Alcohol Abuse who was seen at the [address] [facility] and had labs performed and when the results returned she was contacted to go to the ED due to a low potassium level.  She was evaluated in the ED and her potassium level was found to be 2.2.   She reports that she had nausea, vomiting and diarrhea a little over a week ago, and she reports that she has not had any Alcohol since that time due to her symptoms.

## 2013-12-09 NOTE — H&P (Addendum)
[facility] Admission History and Physical       Alexandra Osborne ZOX:[id number] DOB: Apr 05, 1985 DOA: 12/08/2013  Referring physician: EDP PCP: Tonye Pearson, MD  Specialists:   Chief Complaint:  Abnormal Labs  HPI: Alexandra Osborne is a 28 y.o. female with a history of Alcohol Abuse who was seen at the [address] [facility] and had labs performed and when the results returned she was contacted to go to the ED due to a low potassium level.  She was evaluated in the ED and her potassium level was found to be 2.2.   She reports that she had nausea, vomiting and diarrhea a little over a week ago, and she reports that she has not had any Alcohol since that time due to her symptoms.      Review of Systems:  Constitutional: No Weight Loss, No Weight Gain, Night Sweats, Fevers, Chills, Dizziness, +Malaise, + Fatigue, +Generalized Weakness HEENT: No Headaches, Difficulty Swallowing,Tooth/Dental Problems,Sore Throat,  No Sneezing, Rhinitis, Ear Ache, Nasal Congestion, or Post Nasal Drip,  Cardio-vascular:  No Chest pain, Orthopnea, PND, Edema in Lower Extremities, Anasarca, Dizziness, Palpitations  Resp: No Dyspnea, No DOE, No Cough, No Hemoptysis, No Wheezing.    GI: No Heartburn, Indigestion, Abdominal Pain, Nausea, Vomiting, Diarrhea, Hematemesis, Hematochezia, Melena, Change in Bowel Habits,  Loss of Appetite  GU: No Dysuria, Change in Color of Urine, No Urgency or Frequency, No Flank pain.  Musculoskeletal: No Joint Pain or Swelling, No Decreased Range of Motion, No Back Pain.  Neurologic: No Syncope, No Seizures, Muscle Weakness, Paresthesia, Vision Disturbance or Loss, No Diplopia, No Vertigo, No Difficulty Walking,  Skin: No Rash or Lesions. Psych: No Change in Mood or Affect, No Depression or Anxiety, No Memory loss, No Confusion, or Hallucinations   Past Medical History  Diagnosis Date  . Depression   . Substance abuse     History reviewed. No pertinent past surgical  history.    Prior to Admission medications   Medication Sig Start Date End Date Taking? Authorizing Provider  Multiple Vitamin (MULTIVITAMIN WITH MINERALS) TABS tablet Take 1 tablet by mouth daily.   Yes Historical Provider, MD  naproxen sodium (ANAPROX) 220 MG tablet Take 440 mg by mouth 2 (two) times daily as needed (pain).   Yes Historical Provider, MD  temazepam (RESTORIL) 15 MG capsule Take 15 mg by mouth at bedtime as needed for sleep.    Historical Provider, MD  traZODone (DESYREL) 50 MG tablet Take 1-2 tablets (50-100 mg total) by mouth at bedtime as needed for sleep. 12/08/13   Ethelda Chick, MD      No Known Allergies   Social History:  reports that she has been smoking Cigarettes.  She has been smoking about 0.00 packs per day. She has never used smokeless tobacco. She reports that she drinks alcohol. She reports that she does not use illicit drugs.     Family History  Problem Relation Age of Onset  . Stroke Maternal Grandfather   . Cancer Maternal Grandmother   . Diabetes Maternal Grandmother   . Stroke Other   . Cancer Other   . Hypertension Other        Physical Exam:  GEN:  Agitated Thin Well Developed  28 y.o. Caucasian female examined and in no acute distress; cooperative with exam Filed Vitals:   12/08/13 2207 12/09/13 0041  BP: 109/72 115/86  Pulse: 92 89  Temp: 98.1 F (36.7 C)   TempSrc: Oral   Resp:  19 16  Height: 5\' 6"  (1.676 m)   Weight: 49.442 kg (109 lb)   SpO2: 100% 100%   Blood pressure 115/86, pulse 89, temperature 98.1 F (36.7 C), temperature source Oral, resp. rate 16, height 5\' 6"  (1.676 m), weight 49.442 kg (109 lb), last menstrual period 12/04/2013, SpO2 100 %. PSYCH: She is alert and oriented x4; does not appear anxious does not appear depressed; affect is normal HEENT: Normocephalic and Atraumatic, Mucous membranes pink; PERRLA; EOM intact; Fundi:  Benign;  No scleral icterus, Nares: Patent, Oropharynx: Clear, Fair Dentition,     Neck:  FROM, No Cervical Lymphadenopathy nor Thyromegaly or Carotid Bruit; No JVD; Breasts:: Not examined CHEST WALL: No tenderness CHEST: Normal respiration, clear to auscultation bilaterally HEART: Regular rate and rhythm; no murmurs rubs or gallops BACK: No kyphosis or scoliosis; No CVA tenderness ABDOMEN: Positive Bowel Sounds, Scaphoid, Soft Non-Tender; No Masses, No Organomegaly.    Rectal Exam: Not done EXTREMITIES: No Cyanosis, Clubbing, or Edema; No Ulcerations. Genitalia: not examined PULSES: 2+ and symmetric SKIN: Normal hydration no rash or ulceration CNS:  Alert and Oriented x 4, No focal Deficits Vascular: pulses palpable throughout    Labs on Admission:  Basic Metabolic Panel:  Recent Labs Lab 12/08/13 1231 12/08/13 2304 12/08/13 2313  NA 131* 129* 129*  K 2.3* 2.4* 2.2*  CL 81* 80* 81*  CO2 37* 34*  --   GLUCOSE 86 99 101*  BUN 22 26* 27*  CREATININE 0.72 0.71 0.80  CALCIUM 10.1 10.7*  --   MG  --  1.4*  --    Liver Function Tests:  Recent Labs Lab 12/08/13 1231 12/08/13 2304  AST 260* 227*  ALT 181* 182*  ALKPHOS 96 118*  BILITOT 1.1 0.5  PROT 7.7 7.4  ALBUMIN 4.9 4.2   No results for input(s): LIPASE, AMYLASE in the last 168 hours. No results for input(s): AMMONIA in the last 168 hours. CBC:  Recent Labs Lab 12/08/13 1231 12/08/13 2304 12/08/13 2313  WBC 4.5 3.8*  --   NEUTROABS 2.6 1.8  --   HGB 13.1 11.3* 11.9*  HCT 36.6 32.0* 35.0*  MCV 95.6 97.3  --   PLT 92* 88*  --    Cardiac Enzymes: No results for input(s): CKTOTAL, CKMB, CKMBINDEX, TROPONINI in the last 168 hours.  BNP (last 3 results) No results for input(s): PROBNP in the last 8760 hours. CBG: No results for input(s): GLUCAP in the last 168 hours.  Radiological Exams on Admission: No results found.   EKG: Independently reviewed. Normal Sinus Rhythm at Rate = 86  without Acute Changes   Assessment/Plan:   28 y.o. female with  Principal Problem:   1.   Hypokalemia- caused by Recent N+V+D   Replete K+   Replete Magnesium   Monitor Electrolytes  Active Problems:   2.  Hypomagnesemia- Caused by Recent N+V+D   Replete Magnesium        3.  Hyponatremia- due to Recent N+V+D   Replete Na+ with IVFs      4.  Alcohol abuse-  Reports that her last Drink was 1 week ago   Monitor for signs and Sxs of Withdrawal   CIWA Protocol with PO Ativan if needed     5.  Malnutrition-  Due to Alcohol Abuse   Nutrition consult     6.  Tobacco abuse   Nicotine Patch daily     7.  DVT Prophylaxis   Lovenox     Code  Status:    FULL CODE   Family Communication:    Mother at Bedside Disposition Plan:    Inpatient to Stepdown     Time spent:  6060 Minutes  Ron ParkerJENKINS,Margaretha Mahan C [facility] Pager [phone]   If 7AM -7PM Please Contact the Day Rounding Team MD for [facility]  If 7PM-7AM, Please Contact Night-Floor Coverage  [email or web address] Password [email or web address] 12/09/2013, 1:05 AM

## 2013-12-09 NOTE — Discharge Summary (Signed)
Physician Discharge Summary  Alexandra BridgemanJennifer K Osborne ZOX:[id number] DOB: Dec 14, 1985 DOA: 12/08/2013  PCP: Tonye PearsonOLITTLE, ROBERT P, MD  Admit date: 12/08/2013 Discharge date: 12/09/2013  Recommendations for Outpatient Follow-up:  1. Pt will need to follow up with PCP in 2-3 weeks post discharge 2. Please obtain CMP to evaluate electrolytes and kidney function 3. Please also check CBC to evaluate Hg and Hct levels, Plt's and WBC levels  Discharge Diagnoses:  Principal Problem:   Hypokalemia Active Problems:   Hypomagnesemia   Alcohol abuse   Hyponatremia   Malnutrition   Tobacco abuse  Discharge Condition: Stable  Diet recommendation: Heart healthy diet discussed in details   History of present illness:  28 y.o. female with a history of Alcohol Abuse who [address] [facility] and had labs performed and when the results returned she was contacted to go to the ED due to a low potassium level. She was evaluated in the ED and her potassium level was found to be 2.2. She reports that she had nausea, vomiting and diarrhea a little over a week ago, and she reports that she has not had any Alcohol since that time due to her symptoms.   Hospital Course:  Principal Problem:   Hypokalemia - unclear etiology and possible from vomiting and alcohol abuse  - supplemented and will need to continue taking upon discharge - pt advised to see PCP in one week for blood tests  Active Problems:   Hypomagnesemia - also supplemented and WNL this AM   Alcohol abuse - with alcoholic hepatitis  - counseled on cessation    Hyponatremia - secondary to pre renal etiology - IVF provided and Na improving and is WNL this AM   Malnutrition secondary to alcohol abuse, severe - tolerating current diet well    Tobacco abuse - counseled on cessation    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 - needs close monitoring and cessation of alcohol abuse    Procedures/Studies:  None    Consultations:  None   Antibiotics:  None   Discharge Exam: Filed Vitals:   12/09/13 0800  BP:   Pulse:   Temp: 98 F (36.7 C)  Resp:    Filed Vitals:   12/09/13 0500 12/09/13 0600 12/09/13 0700 12/09/13 0800  BP: 98/52 98/46 94/56    Pulse: 86 81 76   Temp:    98 F (36.7 C)  TempSrc:    Oral  Resp: 15 18 15    Height:      Weight:      SpO2: 98% 98% 99%     General: Pt is alert, follows commands appropriately, not in acute distress Cardiovascular: Regular rate and rhythm, S1/S2 +, no murmurs, no rubs, no gallops Respiratory: Clear to auscultation bilaterally, no wheezing, no crackles, no rhonchi Abdominal: Soft, non tender, non distended, bowel sounds +, no guarding Extremities: no edema, no cyanosis, pulses palpable bilaterally DP and PT Neuro: Grossly nonfocal  Discharge Instructions  Discharge Instructions    Diet - low sodium heart healthy    Complete by:  As directed      Increase activity slowly    Complete by:  As directed             Medication List    TAKE these medications        multivitamin with minerals Tabs tablet  Take 1 tablet by mouth daily.     naproxen sodium 220 MG tablet  Commonly known as:  ANAPROX  Take 440 mg by mouth 2 (two) times daily as needed (pain).     oxyCODONE 5 MG immediate release tablet  Commonly known as:  Oxy IR/ROXICODONE  Take 1 tablet (5 mg total) by mouth every 4 (four) hours as needed for moderate pain.     Potassium Chloride ER 20 MEQ Tbcr  Take 20 mEq by mouth daily.     temazepam 15 MG capsule  Commonly known as:  RESTORIL  Take 15 mg by mouth at bedtime as needed for sleep.     traZODone 50 MG tablet  Commonly known as:  DESYREL  Take 1-2 tablets (50-100 mg total) by mouth at bedtime as needed for sleep.           Follow-up Information    Follow up with DOOLITTLE, Harrel LemonOBERT P, MD.   Specialties:  Internal Medicine, Adolescent Medicine   Contact information:   [address] [place] [id number] [phone]       Follow up with Debbora PrestoMAGICK-Oakland Fant, MD.   Specialty:  Internal Medicine   Why:  If symptoms worsen, As needed   Contact information:   [address] [place] [address] [phone]        The results of significant diagnostics from this hospitalization (including imaging, microbiology, ancillary and laboratory) are listed below for reference.     Microbiology: Recent Results (from the past 240 hour(s))  MRSA PCR Screening     Status: None   Collection Time: 12/09/13  1:57 AM  Result Value Ref Range Status   MRSA by PCR NEGATIVE NEGATIVE Final    Comment:        The GeneXpert MRSA Assay (FDA approved for NASAL specimens only), is one component of a comprehensive MRSA colonization surveillance program. It is not intended to diagnose MRSA infection nor to guide or monitor treatment for MRSA infections.      Labs: Basic Metabolic Panel:  Recent Labs Lab 12/08/13 1231 12/08/13 2304 12/08/13 2313 12/09/13 0345  NA 131* 129* 129* 137  K 2.3* 2.4* 2.2* 3.2*  CL 81* 80* 81* 93*  CO2 37* 34*  --  29  GLUCOSE 86 99 101* 109*  BUN 22 26* 27* 19  CREATININE 0.72 0.71 0.80 0.59  CALCIUM 10.1 10.7*  --  9.1  MG  --  1.4*  --  2.2   Liver Function Tests:  Recent Labs Lab 12/08/13 1231 12/08/13 2304  AST 260* 227*  ALT 181* 182*  ALKPHOS 96 118*  BILITOT 1.1 0.5  PROT 7.7 7.4  ALBUMIN 4.9 4.2   CBC:  Recent Labs Lab 12/08/13 1231 12/08/13 2304 12/08/13 2313 12/09/13 0345  WBC 4.5 3.8*  --  3.6*  NEUTROABS 2.6 1.8  --   --   HGB 13.1 11.3* 11.9* 9.9*  HCT 36.6 32.0* 35.0* 28.0*  MCV 95.6 97.3  --  98.2  PLT 92* 88*  --  88*     SIGNED: Time coordinating discharge: Over 30 minutes  Debbora PrestoMAGICK-Meigan Pates, MD  [facility] 12/09/2013, 8:55 AM Pager [phone]  If 7PM-7AM, please contact night-coverage [email or web address] Password [email or web address]

## 2013-12-09 NOTE — Progress Notes (Signed)
Nutrition Brief Note  Patient identified on the Malnutrition Screening Tool (MST) Report  Wt Readings from Last 15 Encounters:  12/09/13 113 lb 12.1 oz (51.6 kg)  12/08/13 108 lb 12.8 oz (49.351 kg)  07/13/12 115 lb (52.164 kg)  07/12/12 118 lb (53.524 kg)  07/11/12 116 lb 12.8 oz (52.98 kg)  10/31/11 107 lb (48.535 kg)  03/01/11 117 lb (53.071 kg)  02/04/11 118 lb (53.524 kg)    Body mass index is 18.37 kg/(m^2). Patient meets criteria for normal body weight based on current BMI.   Current diet order is regular, patient is consuming approximately 100%% of meals at this time. Labs and medications reviewed.   No nutrition interventions warranted at this time. If nutrition issues arise, please consult RD.   Emmaline KluverHaley Barbette Mcglaun MS, RD, LDN

## 2013-12-09 NOTE — Progress Notes (Signed)
Pt discharged home via family; Pt and family given and explained all discharge instructions, carenotes, and prescriptions; pt and family stated understanding and denied questions/concerns; all f/u appointments in place; IV removed without complicaitons; pt stable at time of discharge   Resources provided for ETOH abuse.

## 2013-12-11 ENCOUNTER — Encounter: Payer: Self-pay | Admitting: Internal Medicine

## 2013-12-11 ENCOUNTER — Ambulatory Visit (INDEPENDENT_AMBULATORY_CARE_PROVIDER_SITE_OTHER): Payer: BC Managed Care – PPO | Admitting: Internal Medicine

## 2013-12-11 VITALS — BP 105/72 | HR 80 | Temp 98.2°F | Resp 16 | Ht 65.5 in | Wt 119.0 lb

## 2013-12-11 DIAGNOSIS — D649 Anemia, unspecified: Secondary | ICD-10-CM

## 2013-12-11 DIAGNOSIS — F1910 Other psychoactive substance abuse, uncomplicated: Secondary | ICD-10-CM

## 2013-12-11 DIAGNOSIS — E876 Hypokalemia: Secondary | ICD-10-CM

## 2013-12-11 MED ORDER — TEMAZEPAM 15 MG PO CAPS: 15.0000 mg | ORAL_CAPSULE | Freq: Every evening | ORAL | Status: DC | PRN

## 2013-12-11 NOTE — Patient Instructions (Signed)
Labs in 4 weeks--just come by 104 anytime

## 2013-12-12 NOTE — Progress Notes (Signed)
   Subjective:    Patient ID: Alexandra BridgemanJennifer K Osborne, female    DOB: [birth year], 28 y.o.   MRN: [id number]  HPI discharged from hospital 12/08/2013 following admission for nausea vomiting diarrhea and hypokalemia Uneventful since discharge Back at work No further alcohol use Now meeting with Marga MelnickJoel Harris for substance abuse counseling which will include group therapy Has a new work position which fits her better Once again is resolved to try and establish an alcohol free life Hopefully understands the gravity of her hypokalemia Anemia noticed discharge   Review of Systems No other changes    Objective:   Physical Exam BP 105/72 mmHg  Pulse 80  Temp(Src) 98.2 F (36.8 C)  Resp 16  Ht 5' 5.5" (1.664 m)  Wt 119 lb (53.978 kg)  BMI 19.49 kg/m2  SpO2 100%  LMP 12/04/2013 Exam appears stable       Assessment & Plan:  Hypokalemia - Anemia, unspecified anemia type -  Abuse, drug or alcohol  Lab will be repeated in 3-4 weeks Follow-up as needed

## 2014-01-04 ENCOUNTER — Encounter (HOSPITAL_COMMUNITY): Payer: Self-pay | Admitting: Emergency Medicine

## 2014-01-04 ENCOUNTER — Inpatient Hospital Stay (HOSPITAL_COMMUNITY)
Admission: EM | Admit: 2014-01-04 | Discharge: 2014-01-05 | DRG: 641 | Discharge status: Against Medical Advice | Payer: BC Managed Care – PPO | Attending: Internal Medicine | Admitting: Internal Medicine

## 2014-01-04 DIAGNOSIS — Z823 Family history of stroke: Secondary | ICD-10-CM | POA: Diagnosis not present

## 2014-01-04 DIAGNOSIS — E872 Acidosis: Secondary | ICD-10-CM | POA: Diagnosis present

## 2014-01-04 DIAGNOSIS — R112 Nausea with vomiting, unspecified: Secondary | ICD-10-CM | POA: Diagnosis present

## 2014-01-04 DIAGNOSIS — E860 Dehydration: Secondary | ICD-10-CM | POA: Diagnosis not present

## 2014-01-04 DIAGNOSIS — E8729 Other acidosis: Secondary | ICD-10-CM | POA: Diagnosis present

## 2014-01-04 DIAGNOSIS — E871 Hypo-osmolality and hyponatremia: Secondary | ICD-10-CM | POA: Diagnosis not present

## 2014-01-04 DIAGNOSIS — F1010 Alcohol abuse, uncomplicated: Secondary | ICD-10-CM | POA: Diagnosis present

## 2014-01-04 DIAGNOSIS — F17210 Nicotine dependence, cigarettes, uncomplicated: Secondary | ICD-10-CM | POA: Diagnosis present

## 2014-01-04 DIAGNOSIS — F411 Generalized anxiety disorder: Secondary | ICD-10-CM | POA: Diagnosis present

## 2014-01-04 DIAGNOSIS — K2970 Gastritis, unspecified, without bleeding: Secondary | ICD-10-CM | POA: Diagnosis present

## 2014-01-04 LAB — CBC WITH DIFFERENTIAL/PLATELET
Basophils Absolute: 0 10*3/uL (ref 0.0–0.1)
Basophils Relative: 1 % (ref 0–1)
Eosinophils Absolute: 0 10*3/uL (ref 0.0–0.7)
Eosinophils Relative: 1 % (ref 0–5)
HCT: 36.5 % (ref 36.0–46.0)
Hemoglobin: 12.8 g/dL (ref 12.0–15.0)
Lymphocytes Relative: 12 % (ref 12–46)
Lymphs Abs: 0.5 10*3/uL — ABNORMAL LOW (ref 0.7–4.0)
MCH: 34.7 pg — ABNORMAL HIGH (ref 26.0–34.0)
MCHC: 35.1 g/dL (ref 30.0–36.0)
MCV: 98.9 fL (ref 78.0–100.0)
Monocytes Absolute: 0.5 10*3/uL (ref 0.1–1.0)
Monocytes Relative: 13 % — ABNORMAL HIGH (ref 3–12)
Neutro Abs: 3 10*3/uL (ref 1.7–7.7)
Neutrophils Relative %: 73 % (ref 43–77)
Platelets: 195 10*3/uL (ref 150–400)
RBC: 3.69 MIL/uL — ABNORMAL LOW (ref 3.87–5.11)
RDW: 12.8 % (ref 11.5–15.5)
WBC: 4.1 10*3/uL (ref 4.0–10.5)

## 2014-01-04 LAB — HEPATIC FUNCTION PANEL
ALT: 90 U/L — ABNORMAL HIGH (ref 0–35)
AST: 123 U/L — ABNORMAL HIGH (ref 0–37)
Albumin: 4.4 g/dL (ref 3.5–5.2)
Alkaline Phosphatase: 111 U/L (ref 39–117)
Bilirubin, Direct: 0.1 mg/dL (ref 0.0–0.3)
Indirect Bilirubin: 1.9 mg/dL — ABNORMAL HIGH (ref 0.3–0.9)
Total Bilirubin: 2 mg/dL — ABNORMAL HIGH (ref 0.3–1.2)
Total Protein: 7 g/dL (ref 6.0–8.3)

## 2014-01-04 LAB — LIPASE, BLOOD: Lipase: 25 U/L (ref 11–59)

## 2014-01-04 LAB — URINE MICROSCOPIC-ADD ON

## 2014-01-04 LAB — ETHANOL: Alcohol, Ethyl (B): <5 mg/dL (ref 0–9)

## 2014-01-04 LAB — BASIC METABOLIC PANEL
Anion gap: 31 — ABNORMAL HIGH (ref 5–15)
Anion gap: 17 — ABNORMAL HIGH (ref 5–15)
BUN: 6 mg/dL (ref 6–23)
CALCIUM: 7.6 mg/dL — AB (ref 8.4–10.5)
CHLORIDE: 92 meq/L — AB (ref 96–112)
CO2: 18 mmol/L — ABNORMAL LOW (ref 19–32)
CO2: 20 mmol/L (ref 19–32)
Calcium: 9.4 mg/dL (ref 8.4–10.5)
Chloride: 80 mEq/L — ABNORMAL LOW (ref 96–112)
Creatinine, Ser: 0.71 mg/dL (ref 0.50–1.10)
Creatinine, Ser: 0.56 mg/dL (ref 0.50–1.10)
GFR calc Af Amer: >90 mL/min (ref >90)
GFR calc Af Amer: >90 mL/min (ref >90)
GFR calc non Af Amer: >90 mL/min (ref >90)
GFR calc non Af Amer: >90 mL/min (ref >90)
Glucose, Bld: 108 mg/dL — ABNORMAL HIGH (ref 70–99)
Glucose, Bld: 101 mg/dL — ABNORMAL HIGH (ref 70–99)
Potassium: 3.5 mmol/L (ref 3.5–5.1)
Potassium: 3.6 mmol/L (ref 3.5–5.1)
Sodium: 129 mmol/L — ABNORMAL LOW (ref 135–145)
Sodium: 129 mmol/L — ABNORMAL LOW (ref 135–145)

## 2014-01-04 LAB — URINALYSIS, ROUTINE W REFLEX MICROSCOPIC
Bilirubin Urine: NEGATIVE
Glucose, UA: NEGATIVE mg/dL
Hgb urine dipstick: NEGATIVE
Ketones, ur: >80 mg/dL — AB
Leukocytes, UA: NEGATIVE
Nitrite: NEGATIVE
Protein, ur: 30 mg/dL — AB
Specific Gravity, Urine: 1.016 (ref 1.005–1.030)
Urobilinogen, UA: 0.2 mg/dL (ref 0.0–1.0)
pH: 5.5 (ref 5.0–8.0)

## 2014-01-04 LAB — RAPID URINE DRUG SCREEN, HOSP PERFORMED
Amphetamines: NOT DETECTED
Barbiturates: NOT DETECTED
Benzodiazepines: NOT DETECTED
Cocaine: NOT DETECTED
Opiates: NOT DETECTED
Tetrahydrocannabinol: NOT DETECTED

## 2014-01-04 LAB — PREGNANCY, URINE: Preg Test, Ur: NEGATIVE

## 2014-01-04 LAB — MAGNESIUM: MAGNESIUM: 1.3 mg/dL — AB (ref 1.5–2.5)

## 2014-01-04 MED ORDER — ONDANSETRON HCL 4 MG/2ML IJ SOLN
4.0000 mg | Freq: Four times a day (QID) | INTRAMUSCULAR | Status: DC | PRN
Start: 2014-01-04 — End: 2014-01-05
  Administered 2014-01-04: 4 mg via INTRAVENOUS
  Filled 2014-01-04: qty 2

## 2014-01-04 MED ORDER — LORAZEPAM 1 MG PO TABS: 1.0000 mg | ORAL_TABLET | Freq: Four times a day (QID) | ORAL | Status: DC | PRN

## 2014-01-04 MED ORDER — LORAZEPAM 2 MG/ML IJ SOLN: 0.0000 mg | Freq: Two times a day (BID) | INTRAMUSCULAR | Status: DC

## 2014-01-04 MED ORDER — ONDANSETRON HCL 4 MG/2ML IJ SOLN: 4.0000 mg | Freq: Once | INTRAMUSCULAR | Status: DC

## 2014-01-04 MED ORDER — SODIUM CHLORIDE 0.9 % IV SOLN
INTRAVENOUS | Status: DC
  Administered 2014-01-04: 19:00:00 via INTRAVENOUS

## 2014-01-04 MED ORDER — FOLIC ACID 1 MG PO TABS
1.0000 mg | ORAL_TABLET | Freq: Every day | ORAL | Status: DC
  Administered 2014-01-04 – 2014-01-05 (×2): 1 mg via ORAL
  Filled 2014-01-04 (×2): qty 1

## 2014-01-04 MED ORDER — ALBUTEROL SULFATE (2.5 MG/3ML) 0.083% IN NEBU: 2.5000 mg | INHALATION_SOLUTION | RESPIRATORY_TRACT | Status: DC | PRN

## 2014-01-04 MED ORDER — SODIUM CHLORIDE 0.9 % IJ SOLN
3.0000 mL | Freq: Two times a day (BID) | INTRAMUSCULAR | Status: DC
  Administered 2014-01-04 – 2014-01-05 (×2): 3 mL via INTRAVENOUS

## 2014-01-04 MED ORDER — PANTOPRAZOLE SODIUM 40 MG IV SOLR
40.0000 mg | INTRAVENOUS | Status: DC
  Administered 2014-01-04: 40 mg via INTRAVENOUS
  Filled 2014-01-04 (×2): qty 40

## 2014-01-04 MED ORDER — LORAZEPAM 2 MG/ML IJ SOLN
0.0000 mg | Freq: Four times a day (QID) | INTRAMUSCULAR | Status: DC
  Administered 2014-01-04 – 2014-01-05 (×3): 2 mg via INTRAVENOUS
  Filled 2014-01-04 (×2): qty 1

## 2014-01-04 MED ORDER — LORAZEPAM 2 MG/ML IJ SOLN
1.0000 mg | Freq: Four times a day (QID) | INTRAMUSCULAR | Status: DC | PRN
  Administered 2014-01-04 – 2014-01-05 (×2): 1 mg via INTRAVENOUS
  Filled 2014-01-04 (×3): qty 1

## 2014-01-04 MED ORDER — THIAMINE HCL 100 MG/ML IJ SOLN
100.0000 mg | Freq: Every day | INTRAMUSCULAR | Status: DC
  Filled 2014-01-04 (×2): qty 1

## 2014-01-04 MED ORDER — PROMETHAZINE HCL 25 MG/ML IJ SOLN: 12.5000 mg | Freq: Once | INTRAMUSCULAR | Status: DC

## 2014-01-04 MED ORDER — ONDANSETRON HCL 4 MG PO TABS: 4.0000 mg | ORAL_TABLET | Freq: Four times a day (QID) | ORAL | Status: DC | PRN

## 2014-01-04 MED ORDER — VITAMIN B-1 100 MG PO TABS
100.0000 mg | ORAL_TABLET | Freq: Every day | ORAL | Status: DC
  Administered 2014-01-04 – 2014-01-05 (×2): 100 mg via ORAL
  Filled 2014-01-04 (×2): qty 1

## 2014-01-04 MED ORDER — ADULT MULTIVITAMIN W/MINERALS CH
1.0000 | ORAL_TABLET | Freq: Every day | ORAL | Status: DC
  Administered 2014-01-04 – 2014-01-05 (×2): 1 via ORAL
  Filled 2014-01-04 (×2): qty 1

## 2014-01-04 MED ORDER — PROMETHAZINE HCL 25 MG/ML IJ SOLN
25.0000 mg | Freq: Once | INTRAMUSCULAR | Status: AC
  Administered 2014-01-04: 25 mg via INTRAVENOUS
  Filled 2014-01-04: qty 1

## 2014-01-04 MED ORDER — GI COCKTAIL ~~LOC~~
30.0000 mL | Freq: Once | ORAL | Status: AC
Start: 2014-01-04 — End: 2014-01-04
  Administered 2014-01-04: 30 mL via ORAL
  Filled 2014-01-04: qty 30

## 2014-01-04 MED ORDER — ACETAMINOPHEN 325 MG PO TABS: 650.0000 mg | ORAL_TABLET | Freq: Four times a day (QID) | ORAL | Status: DC | PRN

## 2014-01-04 MED ORDER — SODIUM CHLORIDE 0.9 % IV BOLUS (SEPSIS)
2000.0000 mL | Freq: Once | INTRAVENOUS | Status: AC
  Administered 2014-01-04: 2000 mL via INTRAVENOUS

## 2014-01-04 MED ORDER — ACETAMINOPHEN 650 MG RE SUPP: 650.0000 mg | Freq: Four times a day (QID) | RECTAL | Status: DC | PRN

## 2014-01-04 NOTE — ED Notes (Signed)
Bed: WA15 Expected date:  Expected time:  Means of arrival:  Comments: Hold for ResusB 

## 2014-01-04 NOTE — ED Notes (Signed)
Patient is unable to urinate at this time 

## 2014-01-04 NOTE — ED Provider Notes (Signed)
CSN: [id number]     Arrival date & time 01/04/14  1353 History   First MD Initiated Contact with Patient 01/04/14 1423     Chief Complaint  Patient presents with  . Alcohol Problem     (Consider location/radiation/quality/duration/timing/severity/associated sxs/prior Treatment) HPI Patient presents to the emergency department with nausea, vomiting following alcohol ingestion.  The patient states that she does not want detox from alcohol, but just wants to help get the vomiting and symptoms under control.  He states that she does not normally drink every day, but she does drink varying amounts multiple times per week.  Patient states that she does not have any hallucinations, weakness, dizziness, back pain, neck pain, dysuria, incontinence, tremor, lethargy, loss of consciousness, rash, chest pain, shortness of breath, abdominal pain, diarrhea or syncope.  Patient states that he has had decreased appetite over the last few days.  Patient states that she binge drinks, but will go days without drinking.  Patient states she has never had a seizure from alcohol withdrawal Past Medical History  Diagnosis Date  . Depression   . Substance abuse   . Alcoholism /alcohol abuse    History reviewed. No pertinent past surgical history. Family History  Problem Relation Age of Onset  . Stroke Maternal Grandfather   . Cancer Maternal Grandmother   . Diabetes Maternal Grandmother   . Stroke Other   . Cancer Other   . Hypertension Other    History  Substance Use Topics  . Smoking status: Current Some Day Smoker    Types: Cigarettes  . Smokeless tobacco: Never Used  . Alcohol Use: 0.0 oz/week     Comment: substance (alcohol) abuse   OB History    No data available     Review of Systems  All other systems negative except as documented in the HPI. All pertinent positives and negatives as reviewed in the HPI.  Allergies  Review of patient's allergies indicates no known allergies.  Home  Medications   Prior to Admission medications   Medication Sig Start Date End Date Taking? Authorizing Provider  Multiple Vitamin (MULTIVITAMIN WITH MINERALS) TABS tablet Take 1 tablet by mouth daily.   Yes Historical Provider, MD  potassium chloride 20 MEQ TBCR Take 20 mEq by mouth daily. 12/09/13  Yes Dorothea Ogle, MD  temazepam (RESTORIL) 15 MG capsule Take 1 capsule (15 mg total) by mouth at bedtime as needed for sleep. 12/11/13  Yes Tonye Pearson, MD  oxyCODONE (OXY IR/ROXICODONE) 5 MG immediate release tablet Take 1 tablet (5 mg total) by mouth every 4 (four) hours as needed for moderate pain. Patient not taking: Reported on 01/04/2014 12/09/13   Dorothea Ogle, MD   BP 133/93 mmHg  Pulse 110  Temp(Src) 98.7 F (37.1 C) (Oral)  Resp 13  SpO2 99%  LMP 12/04/2013 Physical Exam  Constitutional: She is oriented to person, place, and time. She appears well-developed and well-nourished. No distress.  HENT:  Head: Normocephalic and atraumatic.  Mouth/Throat: Mucous membranes are dry.  Neck: Normal range of motion. Neck supple.  Cardiovascular: Normal rate, regular rhythm and normal heart sounds.  Exam reveals no gallop and no friction rub.   No murmur heard. Pulmonary/Chest: Effort normal and breath sounds normal. No respiratory distress.  Abdominal: Soft. Bowel sounds are normal. She exhibits no distension. There is no tenderness. There is no rebound and no guarding.  Musculoskeletal: She exhibits no edema.  Neurological: She is alert and oriented to person, place, and  time. She exhibits normal muscle tone. Coordination normal.  Skin: Skin is warm and dry. No rash noted. No erythema.  Psychiatric: She has a normal mood and affect. Her behavior is normal.  Nursing note and vitals reviewed.   ED Course  Procedures (including critical care time) Labs Review Labs Reviewed  CBC WITH DIFFERENTIAL - Abnormal; Notable for the following:    RBC 3.69 (*)    MCH 34.7 (*)    Lymphs Abs  0.5 (*)    Monocytes Relative 13 (*)    All other components within normal limits  BASIC METABOLIC PANEL - Abnormal; Notable for the following:    Sodium 129 (*)    Chloride 80 (*)    CO2 18 (*)    Glucose, Bld 108 (*)    Anion gap 31 (*)    All other components within normal limits  ETHANOL  URINALYSIS, ROUTINE W REFLEX MICROSCOPIC  URINE RAPID DRUG SCREEN (HOSP PERFORMED)  PREGNANCY, URINE      EKG Interpretation None      MDM   Final diagnoses:  None    Patient will be admitted to the hospital for further care and evaluation.  I spoke with the [facility] who will come down to see the patient   Carlyle Dolly, PA-C 01/05/14 0857  Elwin Mocha, MD 01/05/14 [phone]

## 2014-01-04 NOTE — ED Notes (Addendum)
Pt presents with "long standing" alcohol problem. When asked what her average amount of alcohol ingestion is per day-she says it "varies." Thinks that she is experiencing alcohol poisoning right now. Has received treatment in the past for alcohol issues. Denies other illicit drugs. PA at bedside. Does not want detox help here at this facility. Is starting soon with a "counselor." Denies SI/HI.

## 2014-01-04 NOTE — ED Notes (Signed)
I have just phoned report to [place], RN on [address]; and will transport shortly.

## 2014-01-04 NOTE — H&P (Signed)
[facility] History and Physical  Alexandra Osborne MEQ:[id number] DOB: 01-18-1985 DOA: 01/04/2014   PCP: Leandrew Koyanagi, MD  Specialists: None  Chief Complaint: Nausea and vomiting  HPI: Alexandra Osborne is a 29 y.o. female with a past medical history of alcohol abuse, generalized anxiety disorder who is a very poor historian. She was unable/unwilling to provide too much information for unclear reasons. She basically presented to the hospital due to nausea and vomiting ongoing for 1 day. She has a history of very heavy alcohol intake, but cannot tell me when was the last time she had any alcoholic beverage, but could be about 2 days ago. Denies having any antifreeze or any other kind of illicit alcoholic beverage. Denies trying to hurt herself. She has abdominal discomfort but denies any pain per se. No diarrhea. She's been throwing up clear liquid, occasionally yellowish and green. Denies any blood in the emesis. Has had chills but denies any fever. Denies any urinary discomfort. Doesn't remember when was the last time she had a bowel movement. Denies any illicit drug use. Is not in a sexual relationship. Doesn't recall when was the last sexual intercourse. She has been through alcohol rehabilitation about 2 years ago. She was hospitalized earlier in December for her severe hypokalemia.  Home Medications: Prior to Admission medications   Medication Sig Start Date End Date Taking? Authorizing Provider  Multiple Vitamin (MULTIVITAMIN WITH MINERALS) TABS tablet Take 1 tablet by mouth daily.   Yes Historical Provider, MD  potassium chloride 20 MEQ TBCR Take 20 mEq by mouth daily. 12/09/13  Yes Theodis Blaze, MD  temazepam (RESTORIL) 15 MG capsule Take 1 capsule (15 mg total) by mouth at bedtime as needed for sleep. 12/11/13  Yes Leandrew Koyanagi, MD  oxyCODONE (OXY IR/ROXICODONE) 5 MG immediate release tablet Take 1 tablet (5 mg total) by mouth every 4 (four) hours as needed for moderate  pain. Patient not taking: Reported on 01/04/2014 12/09/13   Theodis Blaze, MD    Allergies: No Known Allergies  Past Medical History: Past Medical History  Diagnosis Date  . Depression   . Substance abuse   . Alcoholism /alcohol abuse    Denies having had surgeries in past.  Social History: She lives in [place] by herself. Smokes occasionally but unable to tell me the quantity. She drinks vodka, and occasionally wine. Unable to tell me the quantity. Denies any illicit drug use. 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 She was somewhat vague about this. Independent with daily activities.  Family History:  Family History  Problem Relation Age of Onset  . Stroke Maternal Grandfather   . Cancer Maternal Grandmother   . Diabetes Maternal Grandmother   . Stroke Other   . Cancer Other   . Hypertension Other      Review of Systems - History obtained from the patient General ROS: positive for  - chills and fatigue Psychological ROS: negative Ophthalmic ROS: negative ENT ROS: negative Allergy and Immunology ROS: negative Hematological and Lymphatic ROS: negative Endocrine ROS: negative Respiratory ROS: no cough, shortness of breath, or wheezing Cardiovascular ROS: no chest pain or dyspnea on exertion Gastrointestinal ROS: as in hpi Genito-Urinary ROS: no dysuria, trouble voiding, or hematuria Musculoskeletal ROS: negative Neurological ROS: no TIA or stroke symptoms Dermatological ROS: negative  Physical Examination  Filed Vitals:   01/04/14 1459 01/04/14 1530 01/04/14 1628 01/04/14 1650  BP: 133/93 128/90 112/95 126/88  Pulse: 110 103 102 100  Temp:      TempSrc:      Resp: '13 16 18 16  ' SpO2: 99% 100% 99% 100%    BP 126/88 mmHg  Pulse 100  Temp(Src) 98.7 F (37.1 C) (Oral)  Resp 16  SpO2 100%  LMP 12/04/2013  General appearance: alert, cooperative, appears stated age and no distress Head: Normocephalic, without obvious abnormality, atraumatic Eyes:  conjunctivae/corneas clear. PERRL, EOM's intact.  Throat: lips, mucosa, and tongue normal; teeth and gums normal Neck: no adenopathy, no carotid bruit, no JVD, supple, symmetrical, trachea midline and thyroid not enlarged, symmetric, no tenderness/mass/nodules Resp: clear to auscultation bilaterally Cardio: regular rate and rhythm, S1, S2 normal, no murmur, click, rub or gallop GI: Soft. Nontender. Nondistended. Bowel sounds are present. No masses or organomegaly. Extremities: extremities normal, atraumatic, no cyanosis or edema Pulses: 2+ and symmetric Skin: Skin color, texture, turgor normal. No rashes or lesions Lymph nodes: Cervical, supraclavicular, and axillary nodes normal. Neurologic: Alert and oriented 3. No focal neurological deficits are present.  Laboratory Data: Results for orders placed or performed during the hospital encounter of 01/04/14 (from the past 48 hour(s))  CBC with Differential     Status: Abnormal   Collection Time: 01/04/14  2:45 PM  Result Value Ref Range   WBC 4.1 4.0 - 10.5 K/uL   RBC 3.69 (L) 3.87 - 5.11 MIL/uL   Hemoglobin 12.8 12.0 - 15.0 g/dL   HCT 36.5 36.0 - 46.0 %   MCV 98.9 78.0 - 100.0 fL   MCH 34.7 (H) 26.0 - 34.0 pg   MCHC 35.1 30.0 - 36.0 g/dL   RDW 12.8 11.5 - 15.5 %   Platelets 195 150 - 400 K/uL   Neutrophils Relative % 73 43 - 77 %   Neutro Abs 3.0 1.7 - 7.7 K/uL   Lymphocytes Relative 12 12 - 46 %   Lymphs Abs 0.5 (L) 0.7 - 4.0 K/uL   Monocytes Relative 13 (H) 3 - 12 %   Monocytes Absolute 0.5 0.1 - 1.0 K/uL   Eosinophils Relative 1 0 - 5 %   Eosinophils Absolute 0.0 0.0 - 0.7 K/uL   Basophils Relative 1 0 - 1 %   Basophils Absolute 0.0 0.0 - 0.1 K/uL  Basic metabolic panel     Status: Abnormal   Collection Time: 01/04/14  2:45 PM  Result Value Ref Range   Sodium 129 (L) 135 - 145 mmol/L    Comment: Please note change in reference range. DELTA CHECK NOTED    Potassium 3.5 3.5 - 5.1 mmol/L    Comment: Please note change in  reference range.   Chloride 80 (L) 96 - 112 mEq/L   CO2 18 (L) 19 - 32 mmol/L   Glucose, Bld 108 (H) 70 - 99 mg/dL   BUN 6 6 - 23 mg/dL   Creatinine, Ser 0.71 0.50 - 1.10 mg/dL   Calcium 9.4 8.4 - 10.5 mg/dL   GFR calc non Af Amer >90 >90 mL/min   GFR calc Af Amer >90 >90 mL/min    Comment: (NOTE) The eGFR has been calculated using the CKD EPI equation. This calculation has not been validated in all clinical situations. eGFR's persistently <90 mL/min signify possible Chronic Kidney Disease.    Anion gap 31 (H) 5 - 15    Comment: REPEATED TO VERIFY  Ethanol     Status: None   Collection Time: 01/04/14  2:45 PM  Result Value Ref Range   Alcohol, Ethyl (B) <5 0 -  9 mg/dL    Comment:        LOWEST DETECTABLE LIMIT FOR SERUM ALCOHOL IS 11 mg/dL FOR MEDICAL PURPOSES ONLY     Radiology Reports: No results found.  Electrocardiogram: Sinus tachycardia in the 120s. Normal axis. Intervals are normal. No concerning ST or T-wave changes are noted.  Problem List  Principal Problem:   Nausea and vomiting Active Problems:   Hyponatremia   Dehydration   Alcohol abuse   Metabolic acidosis, increased anion gap   Assessment: This is a 29 year old Caucasian female who has a history of alcohol abuse who presents with nausea and vomiting ongoing for about 24 hours. She has vomited innumerable amounts of time. This is most likely due to her history of alcoholism. She's also hyponatremic. She has evidence for increased anion gap metabolic acidosis which is most likely due to a combination of alcohol intake and hypovolemia. There is no suspicion for any other kind of ingestion based on history.  Plan: #1 High anion gap metabolic acidosis secondary to alcohol: She'll be aggressively hydrated. The acidosis should correct with this. We will repeat another metabolic panel this evening. And then repeat in the morning as well.  #2 nausea and vomiting: Most likely secondary to gastritis from alcohol  intake. Abdomen is benign. She'll be treated symptomatically. PPI.  #3 history of alcohol abuse: She'll be placed on CIWA withdrawal protocol. She'll be monitored on telemetry. Magnesium will be checked. She has a history of alcoholic hepatitis and has had elevated LFTs in the past. We will check LFTs this admission. We'll check coags also. She has had thrombocytopenia in the past as well. Currently levels are normal, but will be repeated in the morning. Consult social worker to talk to her about rehabilitation.  Urine pregnancy testing and urine drug screen are both pending.  DVT Prophylaxis: SCDs Code Status: Full code Family Communication: Discussed with the patient. Her father was there briefly, but she asked him to step out when I was taking history.  Disposition Plan: Admit to telemetry   Further management decisions will depend on results of further testing and patient's response to treatment.   [facility]  [facility] Pager [phone]  If 7PM-7AM, please contact night-coverage [email or web address] Password [email or web address]  01/04/2014, 5:00 PM

## 2014-01-04 NOTE — ED Notes (Signed)
Patient is still unable to urinate at this time.  

## 2014-01-04 NOTE — ED Notes (Signed)
She is in no distress and her father is with her at her bedside.

## 2014-01-05 DIAGNOSIS — E876 Hypokalemia: Secondary | ICD-10-CM

## 2014-01-05 LAB — CBC
HCT: 30.3 % — ABNORMAL LOW (ref 36.0–46.0)
Hemoglobin: 10.4 g/dL — ABNORMAL LOW (ref 12.0–15.0)
MCH: 34.3 pg — AB (ref 26.0–34.0)
MCHC: 34.3 g/dL (ref 30.0–36.0)
MCV: 100 fL (ref 78.0–100.0)
Platelets: 159 10*3/uL (ref 150–400)
RBC: 3.03 MIL/uL — AB (ref 3.87–5.11)
RDW: 13 % (ref 11.5–15.5)
WBC: 2.4 10*3/uL — ABNORMAL LOW (ref 4.0–10.5)

## 2014-01-05 LAB — COMPREHENSIVE METABOLIC PANEL
ALK PHOS: 89 U/L (ref 39–117)
ALT: 66 U/L — AB (ref 0–35)
AST: 81 U/L — ABNORMAL HIGH (ref 0–37)
Albumin: 3.7 g/dL (ref 3.5–5.2)
Anion gap: 10 (ref 5–15)
BILIRUBIN TOTAL: 1.3 mg/dL — AB (ref 0.3–1.2)
CO2: 24 mmol/L (ref 19–32)
CREATININE: 0.53 mg/dL (ref 0.50–1.10)
Calcium: 8.1 mg/dL — ABNORMAL LOW (ref 8.4–10.5)
Chloride: 97 mEq/L (ref 96–112)
GFR calc Af Amer: >90 mL/min (ref >90)
GFR calc non Af Amer: >90 mL/min (ref >90)
GLUCOSE: 83 mg/dL (ref 70–99)
Potassium: 3.1 mmol/L — ABNORMAL LOW (ref 3.5–5.1)
Sodium: 131 mmol/L — ABNORMAL LOW (ref 135–145)
Total Protein: 5.9 g/dL — ABNORMAL LOW (ref 6.0–8.3)

## 2014-01-05 LAB — PROTIME-INR
INR: 1.03 (ref 0.00–1.49)
PROTHROMBIN TIME: 13.6 s (ref 11.6–15.2)

## 2014-01-05 LAB — APTT: aPTT: 26 seconds (ref 24–37)

## 2014-01-05 MED ORDER — PANTOPRAZOLE SODIUM 40 MG PO TBEC
40.0000 mg | DELAYED_RELEASE_TABLET | Freq: Every day | ORAL | Status: DC
  Administered 2014-01-05: 40 mg via ORAL
  Filled 2014-01-05: qty 1

## 2014-01-05 MED ORDER — MAGNESIUM SULFATE 2 GM/50ML IV SOLN
2.0000 g | Freq: Once | INTRAVENOUS | Status: AC
  Administered 2014-01-05: 2 g via INTRAVENOUS
  Filled 2014-01-05: qty 50

## 2014-01-05 MED ORDER — POTASSIUM CHLORIDE 10 MEQ/100ML IV SOLN
10.0000 meq | INTRAVENOUS | Status: AC
  Administered 2014-01-05 (×4): 10 meq via INTRAVENOUS
  Filled 2014-01-05 (×4): qty 100

## 2014-01-05 MED ORDER — POTASSIUM CHLORIDE CRYS ER 20 MEQ PO TBCR
40.0000 meq | EXTENDED_RELEASE_TABLET | Freq: Once | ORAL | Status: AC
  Administered 2014-01-05: 40 meq via ORAL
  Filled 2014-01-05: qty 2

## 2014-01-05 NOTE — Progress Notes (Signed)
Patient pulled out IV and demanded to sign out AMA. Informed of the risks of leaving AMA , pt verbalized understanding. Dr Rito Ehrlich informed.

## 2014-01-05 NOTE — Discharge Summary (Signed)
  [facility]  Physician Discharge Summary   Patient ID: Alexandra Osborne MRN: [id number] DOB/AGE: [birth year] 29 y.o.  Admit date: 01/04/2014 Discharge date: 01/06/2014  PCP: Tonye Pearson, MD  DISCHARGE DIAGNOSES:  Principal Problem:   Nausea and vomiting Active Problems:   Hyponatremia   Dehydration   Alcohol abuse   Metabolic acidosis, increased anion gap   RECOMMENDATIONS FOR OUTPATIENT FOLLOW UP: 1. PATIENT LEFT AGAINST MEDICAL ADVICE   INITIAL HISTORY: 29 year old Caucasian female was admitted due to ongoing nausea and vomiting. She has a history of alcohol abuse. She was profoundly dehydrated with a high anion gap metabolic acidosis. She was admitted with IV fluids.   HOSPITAL COURSE:   High anion gap metabolic acidosis secondary to alcohol Anion gap is closed with aggressive hydration. Plan was to continue to hydrate her and repeat labs in the morning.  Hyponatremia and hypokalemia. Improved with IV fluids. Potassium was replaced. Magnesium was 1.3 and was corrected intravenously.  Nausea and vomiting Most likely secondary to gastritis from alcohol intake. She was much improved. She was tolerating her liquids. Plan was to advance to solid food today.  History of alcohol abuse Plan was to continue with CIWA withdrawal protocol. She is not actively withdrawing currently. LFTs noted to be abnormal but stable. She is not interested in rehabilitation.   UA was unremarkable. Urine pregnancy test was negative.  Patient was strongly advised not to leave against advice due to potential worsening of her condition. She decided to leave anyway.   PERTINENT LABS:  The results of significant diagnostics from this hospitalization (including imaging, microbiology, ancillary and laboratory) are listed below for reference.     Labs: Basic Metabolic Panel:  Recent Labs Lab 01/04/14 1445 01/04/14 1651 01/04/14 2100 01/05/14 0500  NA 129*  --  129* 131*  K  3.5  --  3.6 3.1*  CL 80*  --  92* 97  CO2 18*  --  20 24  GLUCOSE 108*  --  101* 83  BUN 6  --  <5* <5*  CREATININE 0.71  --  0.56 0.53  CALCIUM 9.4  --  7.6* 8.1*  MG  --  1.3*  --   --    Liver Function Tests:  Recent Labs Lab 01/04/14 1630 01/05/14 0500  AST 123* 81*  ALT 90* 66*  ALKPHOS 111 89  BILITOT 2.0* 1.3*  PROT 7.0 5.9*  ALBUMIN 4.4 3.7    Recent Labs Lab 01/04/14 1630  LIPASE 25   CBC:  Recent Labs Lab 01/04/14 1445 01/05/14 0500  WBC 4.1 2.4*  NEUTROABS 3.0  --   HGB 12.8 10.4*  HCT 36.5 30.3*  MCV 98.9 100.0  PLT 195 159    ALLERGIES: No Known Allergies  No Discharge medications as patient left AMA.  [facility]  [facility] Pager [phone]  01/06/2014, 2:29 PM

## 2014-01-05 NOTE — Progress Notes (Signed)
[facility] PROGRESS NOTE  Alexandra Osborne RUE:[id number] DOB: [birth year] DOA: 01/04/2014  PCP: Tonye Pearson, MD  Brief HPI: 29 year old Caucasian female was admitted due to ongoing nausea and vomiting. She has a history of alcohol abuse. She was profoundly dehydrated with a high anion gap metabolic acidosis. She was admitted with IV fluids.  Past medical history:  Past Medical History  Diagnosis Date  . Depression   . Substance abuse   . Alcoholism /alcohol abuse     Consultants: None  Procedures: None  Antibiotics: None  Subjective: Patient feels better this morning. She's been to the bathroom and back without any problems. Denies any further nausea and vomiting. She wants solid food. Once to go home.  Objective: Vital Signs  Filed Vitals:   01/04/14 1650 01/04/14 1814 01/04/14 2138 01/05/14 0603  BP: 126/88 125/82 118/76 118/84  Pulse: 100 94 117 96  Temp: 98.8 F (37.1 C) 99 F (37.2 C) 99.1 F (37.3 C) 98.5 F (36.9 C)  TempSrc: Oral Oral Oral Oral  Resp: Height:   (1.676 m)    Weight:  49.442 kg (109 lb)    SpO2: 100% 99% 99% 100%    Intake/Output Summary (Last 24 hours) at 01/05/14 0946 Last data filed at 01/05/14 0604  Gross per 24 hour  Intake 1753.33 ml  Output    600 ml  Net 1153.33 ml   Filed Weights   01/04/14 1814  Weight: 49.442 kg (109 lb)    General appearance: alert, cooperative, appears stated age and no distress Resp: clear to auscultation bilaterally Cardio: regular rate and rhythm, S1, S2 normal, no murmur, click, rub or gallop GI: soft, non-tender; bowel sounds normal; no masses,  no organomegaly Extremities: extremities normal, atraumatic, no cyanosis or edema Neurologic: Alert and oriented X 3, normal strength and tone. Normal symmetric reflexes. Normal coordination and gait  Lab Results:  Basic Metabolic Panel:  Recent Labs Lab 01/04/14 1445 01/04/14 1651 01/04/14 2100 01/05/14 0500    NA 129*  --  129* 131*  K 3.5  --  3.6 3.1*  CL 80*  --  92* 97  CO2 18*  --  20 24  GLUCOSE 108*  --  101* 83  BUN 6  --  <5* <5*  CREATININE 0.71  --  0.56 0.53  CALCIUM 9.4  --  7.6* 8.1*  MG  --  1.3*  --   --    Liver Function Tests:  Recent Labs Lab 01/04/14 1630 01/05/14 0500  AST 123* 81*  ALT 90* 66*  ALKPHOS 111 89  BILITOT 2.0* 1.3*  PROT 7.0 5.9*  ALBUMIN 4.4 3.7    Recent Labs Lab 01/04/14 1630  LIPASE 25   CBC:  Recent Labs Lab 01/04/14 1445 01/05/14 0500  WBC 4.1 2.4*  NEUTROABS 3.0  --   HGB 12.8 10.4*  HCT 36.5 30.3*  MCV 98.9 100.0  PLT 195 159    Studies/Results: No results found.  Medications:  Scheduled: . folic acid  1 mg Oral Daily  . LORazepam  0-4 mg Intravenous Q6H   Followed by  . [START ON 01/06/2014] LORazepam  0-4 mg Intravenous Q12H  . multivitamin with minerals  1 tablet Oral Daily  . pantoprazole  40 mg Oral Q1200  . potassium chloride  10 mEq Intravenous Q1 Hr x 4  . sodium chloride  3 mL Intravenous Q12H  . thiamine  100 mg Oral Daily  Continuous: . sodium chloride 100 mL/hr at 01/04/14 1832   ZOX:[id number] **OR** acetaminophen, albuterol, LORazepam **OR** LORazepam, ondansetron **OR** ondansetron (ZOFRAN) IV  Assessment/Plan:  Principal Problem:   Nausea and vomiting Active Problems:   Hyponatremia   Dehydration   Alcohol abuse   Metabolic acidosis, increased anion gap    High anion gap metabolic acidosis secondary to alcohol Anion gap is closed with aggressive hydration. Continue with IV fluids for now. Repeat labs in the morning.  Hyponatremia and hypokalemia. Continue IV fluids. Replace potassium. Magnesium was 1.3. This will also be corrected intravenously.  Nausea and vomiting Most likely secondary to gastritis from alcohol intake. She is much improved. She is tolerating her liquids. She'll be advanced to solid food today.  History of alcohol abuse Continue with CIWA withdrawal protocol.  She is not actively withdrawing currently. LFTs noted to be abnormal but stable. Replace magnesium. She is not interested in rehabilitation.   UA was unremarkable. Urine pregnancy test was negative.  DVT Prophylaxis: SCDs    Code Status: Full code  Family Communication: No family at bedside  Disposition Plan: Continue with current management. Patient is very keen on going home. I have explained to person, that she will need to stay another day. She told me that she will think about it, but might decide to leave AMA.    LOS: 1 day   [facility]  [facility] Pager [phone] 01/05/2014, 9:46 AM  If 7PM-7AM, please contact night-coverage at [email or web address], password [facility]

## 2014-04-07 ENCOUNTER — Telehealth: Payer: Self-pay

## 2014-04-07 NOTE — Telephone Encounter (Signed)
Patient requesting to be added to Wednesday schedule for Dr. Merla Richesoolittle.   [phone] (H)

## 2014-04-07 NOTE — Telephone Encounter (Signed)
Pt.notified

## 2014-04-07 NOTE — Telephone Encounter (Signed)
Spoke with pt. Let her know that you were booked on Wed and that you had appts available on 4/20, but wanted me to ask you anyway's if you could see her. She "just needs a rx rf"

## 2014-04-07 NOTE — Telephone Encounter (Signed)
I can see her at 10:15 as an add on--let her know

## 2014-04-09 ENCOUNTER — Ambulatory Visit (INDEPENDENT_AMBULATORY_CARE_PROVIDER_SITE_OTHER): Payer: BLUE CROSS/BLUE SHIELD | Admitting: Internal Medicine

## 2014-04-09 ENCOUNTER — Encounter: Payer: Self-pay | Admitting: Internal Medicine

## 2014-04-09 VITALS — BP 109/77 | HR 75 | Temp 98.3°F | Resp 16 | Ht 65.0 in | Wt 115.0 lb

## 2014-04-09 DIAGNOSIS — F1010 Alcohol abuse, uncomplicated: Secondary | ICD-10-CM | POA: Diagnosis not present

## 2014-04-09 MED ORDER — FLUTICASONE PROPIONATE 50 MCG/ACT NA SUSP: NASAL | Status: AC

## 2014-04-09 MED ORDER — TOPIRAMATE 50 MG PO TABS
50.0000 mg | ORAL_TABLET | Freq: Every day | ORAL | Status: DC
Start: 2014-04-09 — End: 2015-04-17

## 2014-04-09 MED ORDER — NALTREXONE HCL 50 MG PO TABS: 50.0000 mg | ORAL_TABLET | Freq: Every day | ORAL | Status: DC

## 2014-04-09 MED ORDER — OLOPATADINE HCL 0.1 % OP SOLN: 1.0000 [drp] | Freq: Two times a day (BID) | OPHTHALMIC | Status: DC

## 2014-04-09 NOTE — Progress Notes (Signed)
   Subjective:    Patient ID: Alexandra BridgemanJennifer K Osborne, female    DOB: 05/19/1985, 29 y.o.   MRN: [id number]  HPI here for follow-up -alcohol abuse Off alcohol for 8-10 days now She has a good new job She would like to use medication to control her relapses She understands the role of therapy and has support structure set up There is of course the over thinking and the dwelling on all of her past failures that leads her to have such a poor self-esteem feeding and all of her relapses Her parents are now divorced, mother [place], father still  offering support in a tough love fashion She usually relapses when stress confronts her with all her numerous past failures and she needs some relief  She also has problems with constant conjunctival injection and itching and rhinorrhea in the apartment which she lives. It is worse at night. It is an old house with an air conditioner window unit. No wheezing or coughing  See the extensive past history of medications and therapies we have attempted   Review of Systems No weight loss or night sweats Activity level good No cough or chest pain Palpitations negative Appetite good She does have chronic headaches which sound like they are not typical migraines in which interfere sometimes but are not overwhelmingly negative.    Objective:   Physical Exam BP 109/77 mmHg  Pulse 75  Temp(Src) 98.3 F (36.8 C) (Oral)  Resp 16  Ht 5\' 5"  (1.651 m)  Wt 115 lb (52.164 kg)  BMI 19.14 kg/m2  SpO2 100%  LMP 03/09/2014 PERRLA with EOMs conjugate Conjunctiva injected Nares with inflamed turbinates and clear rhinorrhea Throat clear No nodes or thyromegaly Chest clear Mood good and affect appropriate Judgment sound Thought content normal       Assessment & Plan:  Alcohol abuse-many many relapses  Many failed detox hospitalizations  start naltrexone  Frequent headaches  Start Topamax which should help augment the effects of  naltrexone  Allergic rhinitis and conjunctivitis  Zyrtec, Flonase, and Patanol  F/u 1 mo Meds ordered this encounter  Medications  . naltrexone (DEPADE) 50 MG tablet    Sig: Take 1 tablet (50 mg total) by mouth daily.    Dispense:  30 tablet    Refill:  2  . topiramate (TOPAMAX) 50 MG tablet    Sig: Take 1 tablet (50 mg total) by mouth daily. At bedtime    Dispense:  30 tablet    Refill:  2  . fluticasone (FLONASE) 50 MCG/ACT nasal spray    Sig: 1 spray each nostril twice a day    Dispense:  16 g    Refill:  6  . olopatadine (PATANOL) 0.1 % ophthalmic solution    Sig: Place 1 drop into both eyes 2 (two) times daily.    Dispense:  5 mL    Refill:  12

## 2014-05-07 ENCOUNTER — Ambulatory Visit: Payer: BLUE CROSS/BLUE SHIELD | Admitting: Internal Medicine

## 2014-06-16 IMAGING — CT CT HEAD W/O CM
2 series · 17 of 30 positions shown, 20 images · non-contrast
Comparison: 10/31/2011.

CLINICAL DATA: Mental status changes.

EXAM:
CT HEAD WITHOUT CONTRAST
TECHNIQUE: Contiguous axial images were obtained from the base of the skull
through the vertex without intravenous contrast.

[Series 2: head w/o · axial · non-contrast · 0.45mm/px · z∈[-173,-63]mm · 9 of 28 slices shown, 12 images]
[im 3/28  brain]
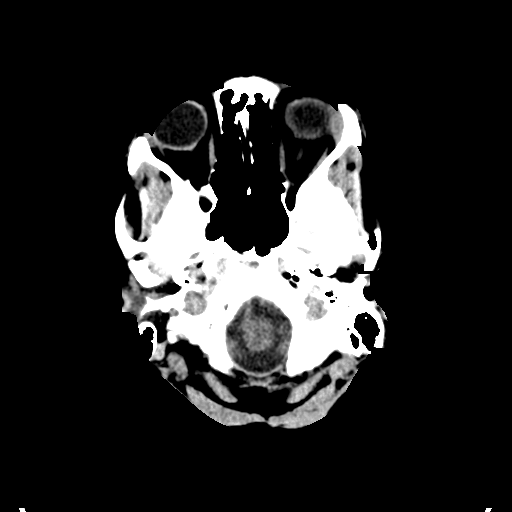
[im 3/28  bone]
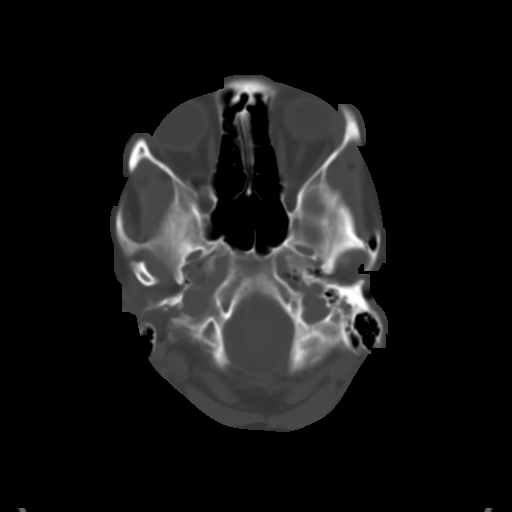
[im 6/28  brain]
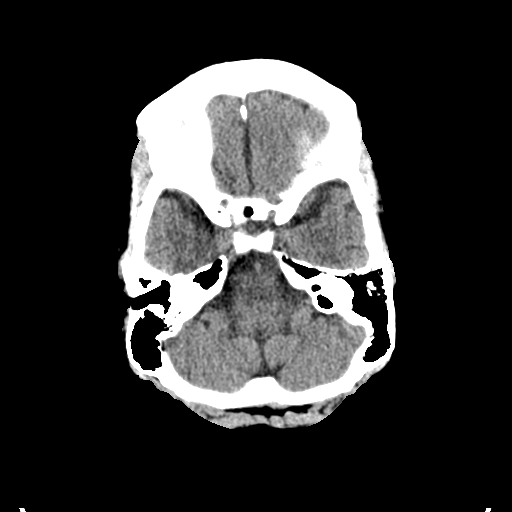
[im 9/28  brain]
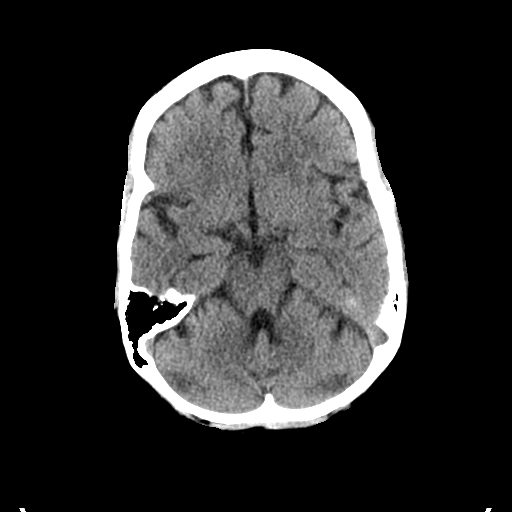
[im 11/28  brain]
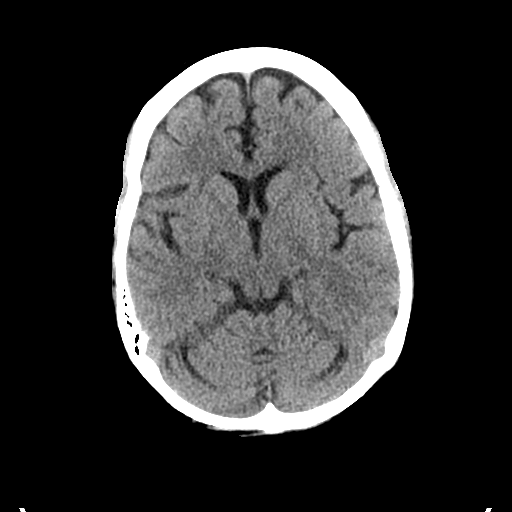
[im 14/28  brain]
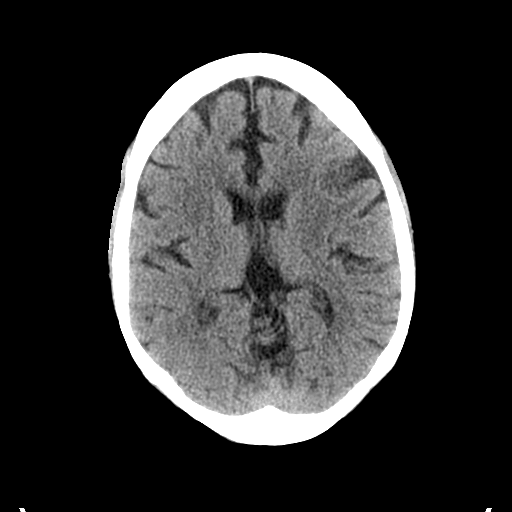
[im 14/28  bone]
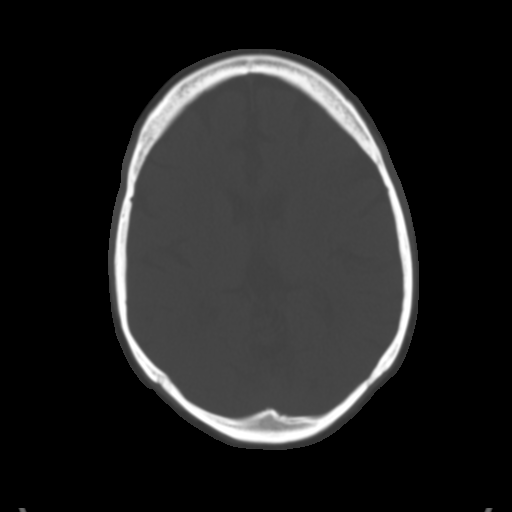
[im 17/28  brain]
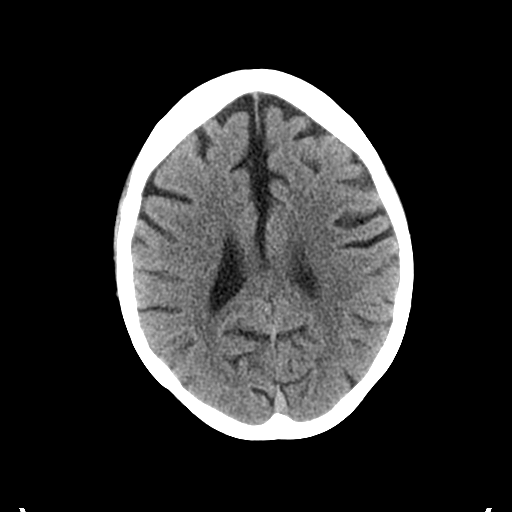
[im 19/28  brain]
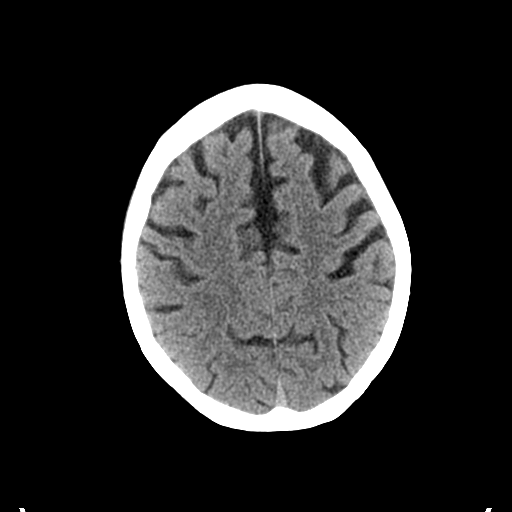
[im 22/28  brain]
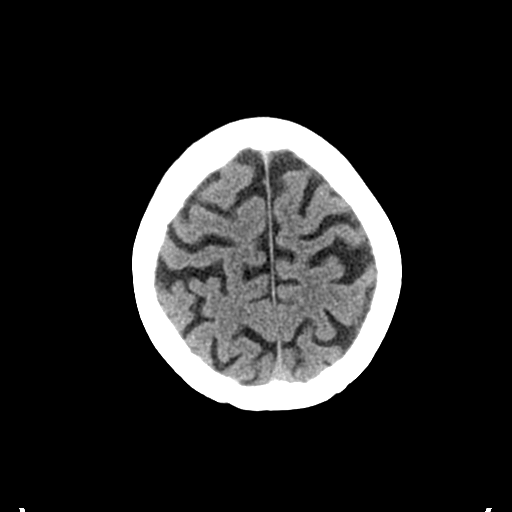
[im 25/28  brain]
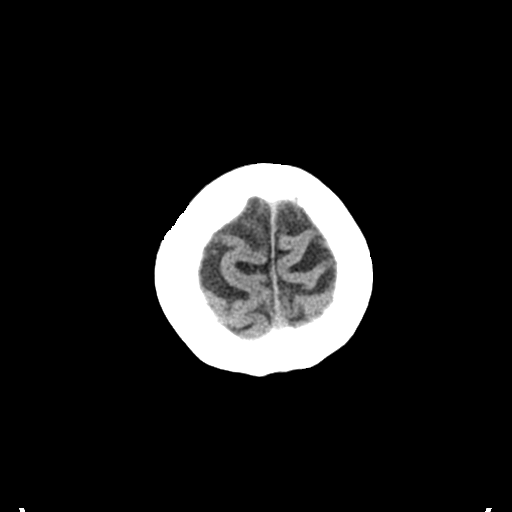
[im 25/28  bone]
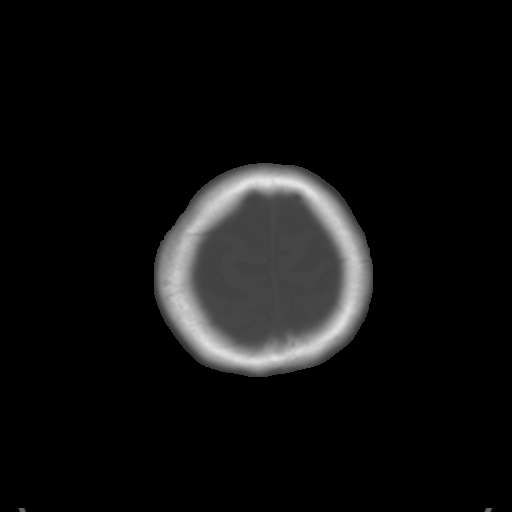

[Series 3: bone windows · axial · 0.45mm/px · z∈[-168,-63]mm · 8 of 46 slices shown]
[im 6/46  bone]
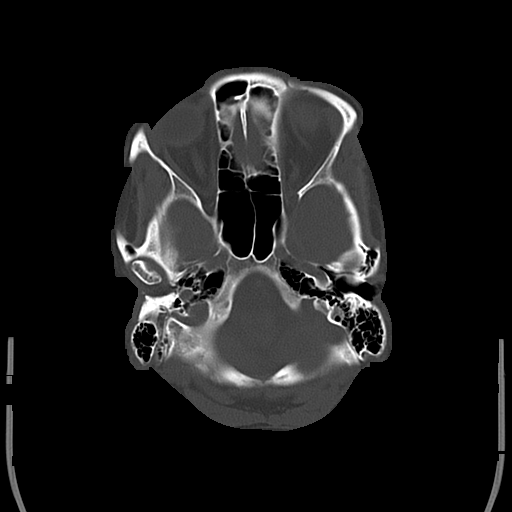
[im 11/46  bone]
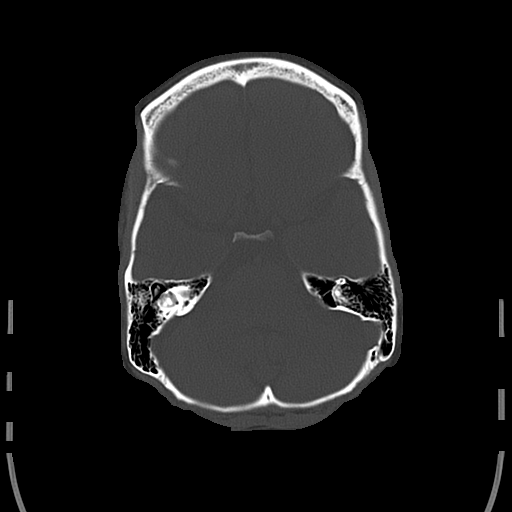
[im 16/46  bone]
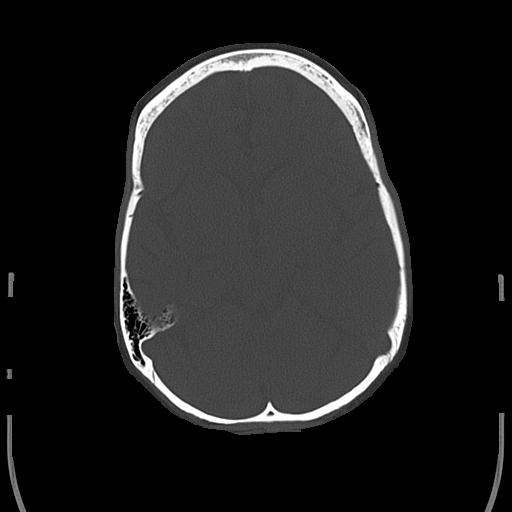
[im 21/46  bone]
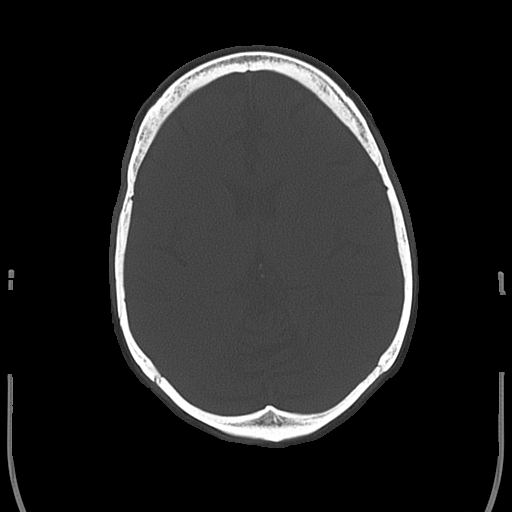
[im 26/46  bone]
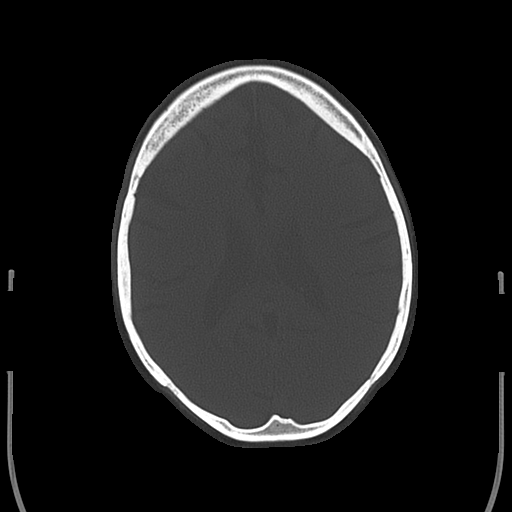
[im 31/46  bone]
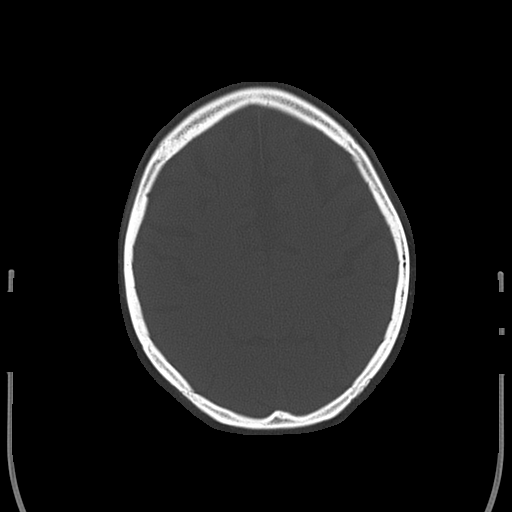
[im 36/46  bone]
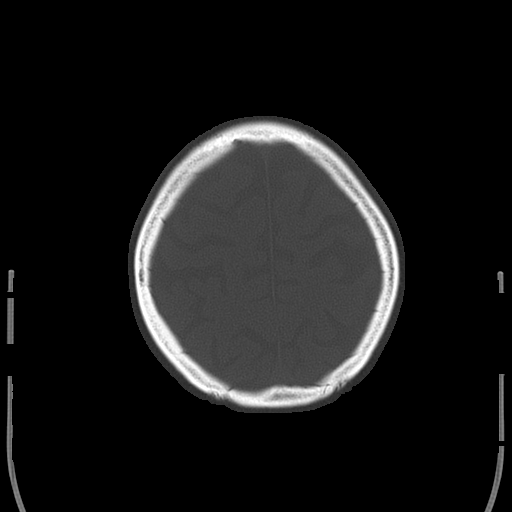
[im 41/46  bone]
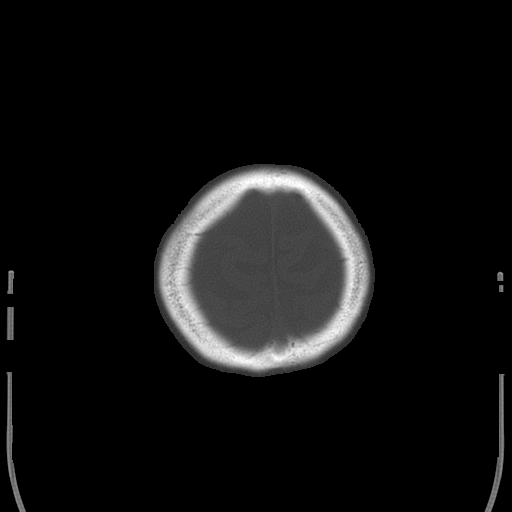

[17 of 30 positions shown; findings below may reference images not displayed]

FINDINGS: The ventricles are normal in size and configuration. Cavum septum
pellucidum et verge again noted. No extra-axial fluid collections
are identified. The gray-white differentiation is normal. No CT
findings for acute intracranial process such as hemorrhage or
infarction. No mass lesions. The brainstem and cerebellum are
grossly normal.

The bony structures are intact. The paranasal sinuses and mastoid
air cells are clear. The globes are intact.
IMPRESSION: Normal head CT.

## 2014-12-01 ENCOUNTER — Encounter: Payer: Self-pay | Admitting: Internal Medicine

## 2014-12-02 ENCOUNTER — Ambulatory Visit (INDEPENDENT_AMBULATORY_CARE_PROVIDER_SITE_OTHER): Payer: BLUE CROSS/BLUE SHIELD | Admitting: Internal Medicine

## 2014-12-02 VITALS — BP 128/72 | HR 78 | Temp 98.0°F | Resp 16

## 2014-12-02 DIAGNOSIS — G4700 Insomnia, unspecified: Secondary | ICD-10-CM

## 2014-12-02 DIAGNOSIS — F1010 Alcohol abuse, uncomplicated: Secondary | ICD-10-CM | POA: Diagnosis not present

## 2014-12-02 MED ORDER — TEMAZEPAM 30 MG PO CAPS: 30.0000 mg | ORAL_CAPSULE | Freq: Every evening | ORAL | Status: DC | PRN

## 2014-12-02 NOTE — Progress Notes (Signed)
   Subjective:    Patient ID: Alexandra Osborne, female    DOB: 02-27-1985, 29 y.o.   MRN: [id number] This chart was scribed for Ellamae Siaobert Agata Lucente, MD by Jolene Provostobert Halas, Medical Scribe. This patient was seen in Room 1 and the patient's care was started a 4:09 PM.  Chief Complaint  Patient presents with  . Medication Refill    Temazepam 15mg , pt. wants to go higher on dosage     HPI HPI Comments: Alexandra Osborne is a 29 y.o. female who [facility] complaining of sleep disturbance. She states she has been much better because she is extremely busy. She states she has tried temazepam 30mg  in the past, which has allowed her to sleep.  See last OV--off those meds and now working at job that keeps her too busy to think--recently around sis who has 3 w/ #4 on the way---overwhelmed    Review of Systems  Psychiatric/Behavioral: Positive for sleep disturbance.   Past Medical History  Diagnosis Date  . Depression   . Substance abuse   . Alcoholism /alcohol abuse (HCC)       Objective:  BP 128/72 mmHg  Pulse 78  Temp(Src) 98 F (36.7 C) (Oral)  Resp 16  LMP 11/11/2014 (Approximate)  Physical Exam  Constitutional: She is oriented to person, place, and time. She appears well-developed and well-nourished. No distress.  HENT:  Head: Normocephalic and atraumatic.  Eyes: Pupils are equal, round, and reactive to light.  Neck: Neck supple.  Cardiovascular: Normal rate.   Pulmonary/Chest: Effort normal. No respiratory distress.  Musculoskeletal: Normal range of motion.  Neurological: She is alert and oriented to person, place, and time. Coordination normal.  Skin: Skin is warm and dry. She is not diaphoretic.  Psychiatric: She has a normal mood and affect. Her behavior is normal.  Nursing note and vitals reviewed.     Assessment & Plan:  Insomnia  Alcohol abuse---stable since last OV  Meds ordered this encounter  Medications  . temazepam (RESTORIL) 30 MG capsule    Sig: Take 1  capsule (30 mg total) by mouth at bedtime as needed for sleep.    Dispense:  30 capsule    Refill:  5   Fu or call 6 mos I have completed the patient encounter in its entirety as documented by the scribe, with editing by me where necessary. Daymien Goth P. Merla Richesoolittle, M.D.   By signing my name below, I, Javier Dockerobert Ryan Halas, attest that this documentation has been prepared under the direction and in the presence of Ellamae Siaobert Kendry Pfarr, MD. Electronically Signed: Javier Dockerobert Ryan Halas, ER Scribe. 12/02/2014. 4:09 PM.

## 2014-12-02 NOTE — Addendum Note (Signed)
Addended by: Tonye PearsonOLITTLE, Jaquaveon Bilal P on: 12/02/2014 09:22 PM   Modules accepted: Level of Service

## 2015-04-15 ENCOUNTER — Ambulatory Visit (INDEPENDENT_AMBULATORY_CARE_PROVIDER_SITE_OTHER): Payer: BLUE CROSS/BLUE SHIELD | Admitting: Family Medicine

## 2015-04-15 VITALS — BP 108/72 | HR 118 | Temp 98.0°F | Resp 16 | Ht 65.0 in | Wt 113.0 lb

## 2015-04-15 DIAGNOSIS — S0990XA Unspecified injury of head, initial encounter: Secondary | ICD-10-CM | POA: Diagnosis not present

## 2015-04-15 DIAGNOSIS — E876 Hypokalemia: Secondary | ICD-10-CM | POA: Diagnosis not present

## 2015-04-15 DIAGNOSIS — H109 Unspecified conjunctivitis: Secondary | ICD-10-CM | POA: Diagnosis not present

## 2015-04-15 DIAGNOSIS — R7989 Other specified abnormal findings of blood chemistry: Secondary | ICD-10-CM | POA: Diagnosis not present

## 2015-04-15 DIAGNOSIS — S2020XA Contusion of thorax, unspecified, initial encounter: Secondary | ICD-10-CM

## 2015-04-15 DIAGNOSIS — S0993XA Unspecified injury of face, initial encounter: Secondary | ICD-10-CM

## 2015-04-15 DIAGNOSIS — D649 Anemia, unspecified: Secondary | ICD-10-CM | POA: Diagnosis not present

## 2015-04-15 DIAGNOSIS — E871 Hypo-osmolality and hyponatremia: Secondary | ICD-10-CM

## 2015-04-15 DIAGNOSIS — F1010 Alcohol abuse, uncomplicated: Secondary | ICD-10-CM

## 2015-04-15 DIAGNOSIS — R21 Rash and other nonspecific skin eruption: Secondary | ICD-10-CM

## 2015-04-15 DIAGNOSIS — R945 Abnormal results of liver function studies: Secondary | ICD-10-CM

## 2015-04-15 LAB — CBC
HCT: 27.1 % — ABNORMAL LOW (ref 35.0–45.0)
HEMOGLOBIN: 9.5 g/dL — AB (ref 11.7–15.5)
MCH: 32.8 pg (ref 27.0–33.0)
MCHC: 35.1 g/dL (ref 32.0–36.0)
MCV: 93.4 fL (ref 80.0–100.0)
MPV: 11.4 fL (ref 7.5–12.5)
PLATELETS: 186 10*3/uL (ref 140–400)
RBC: 2.9 MIL/uL — ABNORMAL LOW (ref 3.80–5.10)
RDW: 14 % (ref 11.0–15.0)
WBC: 4.8 10*3/uL (ref 3.8–10.8)

## 2015-04-15 LAB — POC MICROSCOPIC URINALYSIS (UMFC): MUCUS RE: ABSENT

## 2015-04-15 LAB — COMPREHENSIVE METABOLIC PANEL
ALBUMIN: 4 g/dL (ref 3.6–5.1)
ALT: 210 U/L — ABNORMAL HIGH (ref 6–29)
AST: 197 U/L — ABNORMAL HIGH (ref 10–30)
Alkaline Phosphatase: 106 U/L (ref 33–115)
BUN: 14 mg/dL (ref 7–25)
CO2: 42 mmol/L — ABNORMAL HIGH (ref 20–31)
CREATININE: 0.66 mg/dL (ref 0.50–1.10)
Calcium: 9.6 mg/dL (ref 8.6–10.2)
Chloride: <80 mmol/L — ABNORMAL LOW (ref 98–110)
Glucose, Bld: 77 mg/dL (ref 65–99)
Potassium: 2.8 mmol/L — ABNORMAL LOW (ref 3.5–5.3)
SODIUM: 128 mmol/L — AB (ref 135–146)
Total Bilirubin: 1.1 mg/dL (ref 0.2–1.2)
Total Protein: 6.1 g/dL (ref 6.1–8.1)

## 2015-04-15 LAB — POCT URINALYSIS DIP (MANUAL ENTRY)
Bilirubin, UA: NEGATIVE
Blood, UA: NEGATIVE
GLUCOSE UA: NEGATIVE
Ketones, POC UA: NEGATIVE
LEUKOCYTES UA: NEGATIVE
NITRITE UA: NEGATIVE
Protein Ur, POC: NEGATIVE
Spec Grav, UA: 1.01
UROBILINOGEN UA: 1
pH, UA: 8.5

## 2015-04-15 MED ORDER — KETOROLAC TROMETHAMINE 60 MG/2ML IM SOLN
60.0000 mg | Freq: Once | INTRAMUSCULAR | Status: AC
  Administered 2015-04-15: 60 mg via INTRAMUSCULAR

## 2015-04-15 MED ORDER — ERYTHROMYCIN 5 MG/GM OP OINT: 1.0000 "application " | TOPICAL_OINTMENT | Freq: Four times a day (QID) | OPHTHALMIC | Status: AC

## 2015-04-15 NOTE — Patient Instructions (Addendum)
  For rash on your bottom- take a shower and clean with mild soap and warm (not hot water) Apply a thick layer of Desitin (generic is fine) diaper ointment Pat dry after going to the bathroom. Wear loose, cotton underwear For pain, you can take ibuprofen- 2-3 tablets every 8 hours or Alleve 2 tablets every 12 hours.  Do not take any more anti- inflammatory medicine until Thursday, 04/16/15 since you received an injection today   IF you received an x-ray today, you will receive an invoice from [facility]. Please contact [facility] at [phone] with questions or concerns regarding your invoice.   IF you received labwork today, you will receive an invoice from [facility]. Please contact [facility] at [phone] with questions or concerns regarding your invoice.   Our billing staff will not be able to assist you with questions regarding bills from these companies.  You will be contacted with the lab results as soon as they are available. The fastest way to get your results is to activate your My Chart account. Instructions are located on the last page of this paperwork. If you have not heard from us regarding the results in 2 weeks, please contact this office.

## 2015-04-15 NOTE — Progress Notes (Deleted)
Subjective:     Patient ID: Alexandra BridgemanJennifer K Osborne, female   DOB: 10-03-1985, 30 y.o.   MRN: [id number]  HPI   Review of Systems     Objective:   Physical Exam     Assessment:     ***    Plan:     ***
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
## 2015-04-16 ENCOUNTER — Other Ambulatory Visit (INDEPENDENT_AMBULATORY_CARE_PROVIDER_SITE_OTHER): Payer: BLUE CROSS/BLUE SHIELD

## 2015-04-16 DIAGNOSIS — R945 Abnormal results of liver function studies: Secondary | ICD-10-CM

## 2015-04-16 DIAGNOSIS — D649 Anemia, unspecified: Secondary | ICD-10-CM

## 2015-04-16 DIAGNOSIS — R7989 Other specified abnormal findings of blood chemistry: Secondary | ICD-10-CM

## 2015-04-16 DIAGNOSIS — E876 Hypokalemia: Secondary | ICD-10-CM

## 2015-04-16 DIAGNOSIS — E871 Hypo-osmolality and hyponatremia: Secondary | ICD-10-CM

## 2015-04-16 LAB — COMPREHENSIVE METABOLIC PANEL
ALT: 205 U/L — ABNORMAL HIGH (ref 6–29)
AST: 166 U/L — AB (ref 10–30)
Albumin: 3.9 g/dL (ref 3.6–5.1)
Alkaline Phosphatase: 116 U/L — ABNORMAL HIGH (ref 33–115)
BILIRUBIN TOTAL: 1 mg/dL (ref 0.2–1.2)
BUN: 8 mg/dL (ref 7–25)
CO2: 34 mmol/L — AB (ref 20–31)
CREATININE: 0.71 mg/dL (ref 0.50–1.10)
Calcium: 9.5 mg/dL (ref 8.6–10.2)
Chloride: 84 mmol/L — ABNORMAL LOW (ref 98–110)
GLUCOSE: 106 mg/dL — AB (ref 65–99)
Potassium: 2.8 mmol/L — ABNORMAL LOW (ref 3.5–5.3)
SODIUM: 131 mmol/L — AB (ref 135–146)
Total Protein: 5.8 g/dL — ABNORMAL LOW (ref 6.1–8.1)

## 2015-04-16 LAB — POCT CBC
Granulocyte percent: 52.3 %G (ref 37–80)
HCT, POC: 24.9 % — AB (ref 37.7–47.9)
Hemoglobin: 8.9 g/dL — AB (ref 12.2–16.2)
LYMPH, POC: 2.3 (ref 0.6–3.4)
MCH, POC: 35.1 pg — AB (ref 27–31.2)
MCHC: 35.8 g/dL — AB (ref 31.8–35.4)
MCV: 97.8 fL — AB (ref 80–97)
MID (cbc): 1 — AB (ref 0–0.9)
MPV: 7.9 fL (ref 0–99.8)
PLATELET COUNT, POC: 244 10*3/uL (ref 142–424)
POC Granulocyte: 3.6 (ref 2–6.9)
POC LYMPH %: 33.5 % (ref 10–50)
POC MID %: 14.2 %M — AB (ref 0–12)
RBC: 2.54 M/uL — AB (ref 4.04–5.48)
RDW, POC: 14.4 %
WBC: 6.8 10*3/uL (ref 4.6–10.2)

## 2015-04-16 NOTE — Progress Notes (Unsigned)
Patient here today for labs only. °

## 2015-04-17 ENCOUNTER — Other Ambulatory Visit: Payer: Self-pay | Admitting: Family Medicine

## 2015-04-17 DIAGNOSIS — E876 Hypokalemia: Secondary | ICD-10-CM

## 2015-04-17 MED ORDER — POTASSIUM CHLORIDE ER 20 MEQ PO TBCR: 20.0000 meq | EXTENDED_RELEASE_TABLET | Freq: Two times a day (BID) | ORAL | Status: AC

## 2015-04-19 NOTE — Progress Notes (Signed)
Patient ID: Alexandra BridgemanJennifer K Osborne, female   DOB: [birth year], 30 y.o.   MRN: [id number] Pt assessed, reviewed documentation and agree w/ assessment and plan. Norberto SorensonEva Shaw, MD MPH

## 2015-04-26 ENCOUNTER — Encounter: Payer: Self-pay | Admitting: Internal Medicine

## 2015-04-27 ENCOUNTER — Telehealth: Payer: Self-pay

## 2015-04-27 NOTE — Telephone Encounter (Signed)
Pt's mom called wanting results from 4/13. Alexandra SprangDebbie Gessner left VM with results on 4/14 and had sent in potassium supplement. Pt is now in rehab so she will need supplement sent to a different pharmacy.  Pt's mom will call back with pharmacy information.

## 2015-07-24 ENCOUNTER — Other Ambulatory Visit: Payer: Self-pay

## 2015-07-24 NOTE — Telephone Encounter (Signed)
fax req Temazepam 30mg  #30 5-RF - sent to [place] pool

## 2015-07-28 ENCOUNTER — Other Ambulatory Visit: Payer: Self-pay

## 2015-07-28 NOTE — Telephone Encounter (Signed)
Pharm reqs RF of temazepam. Pended. Alexandra Osborne.

## 2015-07-28 NOTE — Telephone Encounter (Signed)
No refills on controlled medication.  Pt needs to see her new PCP for this.  If I were her PCP, I would refer her to psychiatry for medication management. Let me know if she needs recommendations on either.

## 2015-07-28 NOTE — Telephone Encounter (Signed)
Route to Dr. Clelia Croft, who most recently evaluated this patient.

## 2015-07-31 MED ORDER — TEMAZEPAM 30 MG PO CAPS: 30.0000 mg | ORAL_CAPSULE | Freq: Every evening | ORAL | 0 refills | Status: AC | PRN

## 2015-07-31 NOTE — Telephone Encounter (Signed)
Pt called back and stated that she is in [place] helping her mother move, and then will come back to [place] and close on her house Tues and leave for [place] where she is moving. She will get a new PCP there, but reqs 1 more RF of temazepam.

## 2015-07-31 NOTE — Telephone Encounter (Signed)
Done

## 2015-07-31 NOTE — Telephone Encounter (Signed)
Called pt and advised message from provider on their voicemail.  

## 2015-07-31 NOTE — Addendum Note (Signed)
Addended by: Morrell Riddle on: 07/31/2015 05:23 PM   Modules accepted: Orders

## 2015-07-31 NOTE — Telephone Encounter (Signed)
Pt called back and wanted to see if she could get one last refill and then she will have a PCP. She is moving and really needs it for one more month if possible.

## 2015-07-31 NOTE — Telephone Encounter (Signed)
Called pt advised of RF. She asked me to send to [facility] so she can p/up when she comes through town Monday. Done.

## 2015-07-31 NOTE — Addendum Note (Signed)
Addended by: Sheppard Plumber A on: 07/31/2015 04:03 PM   Modules accepted: Orders

## 2017-09-22 ENCOUNTER — Other Ambulatory Visit: Payer: Self-pay

## 2017-09-22 ENCOUNTER — Encounter (HOSPITAL_COMMUNITY): Payer: Self-pay | Admitting: Emergency Medicine

## 2017-09-22 ENCOUNTER — Emergency Department (HOSPITAL_COMMUNITY)
Admission: EM | Admit: 2017-09-22 | Discharge: 2017-09-23 | Disposition: A | Discharge status: Home / Self Care | Payer: BLUE CROSS/BLUE SHIELD | Attending: Emergency Medicine | Admitting: Emergency Medicine

## 2017-09-22 DIAGNOSIS — F17210 Nicotine dependence, cigarettes, uncomplicated: Secondary | ICD-10-CM | POA: Insufficient documentation

## 2017-09-22 DIAGNOSIS — F10230 Alcohol dependence with withdrawal, uncomplicated: Secondary | ICD-10-CM | POA: Insufficient documentation

## 2017-09-22 DIAGNOSIS — F10930 Alcohol use, unspecified with withdrawal, uncomplicated: Secondary | ICD-10-CM

## 2017-09-22 DIAGNOSIS — R1013 Epigastric pain: Secondary | ICD-10-CM | POA: Diagnosis not present

## 2017-09-22 DIAGNOSIS — R112 Nausea with vomiting, unspecified: Secondary | ICD-10-CM | POA: Diagnosis present

## 2017-09-22 LAB — URINALYSIS, ROUTINE W REFLEX MICROSCOPIC
GLUCOSE, UA: NEGATIVE mg/dL
HGB URINE DIPSTICK: NEGATIVE
KETONES UR: NEGATIVE mg/dL
LEUKOCYTES UA: NEGATIVE
Nitrite: NEGATIVE
PROTEIN: 30 mg/dL — AB
Specific Gravity, Urine: 1.021 (ref 1.005–1.030)
pH: 8 (ref 5.0–8.0)

## 2017-09-22 LAB — COMPREHENSIVE METABOLIC PANEL
ALBUMIN: 4.7 g/dL (ref 3.5–5.0)
ALT: 65 U/L — ABNORMAL HIGH (ref 0–44)
ANION GAP: 16 — AB (ref 5–15)
AST: 95 U/L — ABNORMAL HIGH (ref 15–41)
Alkaline Phosphatase: 87 U/L (ref 38–126)
BILIRUBIN TOTAL: 3 mg/dL — AB (ref 0.3–1.2)
BUN: 5 mg/dL — ABNORMAL LOW (ref 6–20)
CO2: 25 mmol/L (ref 22–32)
Calcium: 9.4 mg/dL (ref 8.9–10.3)
Chloride: 99 mmol/L (ref 98–111)
Creatinine, Ser: 0.85 mg/dL (ref 0.44–1.00)
GFR calc non Af Amer: >60 mL/min (ref >60)
GLUCOSE: 118 mg/dL — AB (ref 70–99)
POTASSIUM: 3.3 mmol/L — AB (ref 3.5–5.1)
SODIUM: 140 mmol/L (ref 135–145)
Total Protein: 7 g/dL (ref 6.5–8.1)

## 2017-09-22 LAB — CBC
HEMATOCRIT: 40.7 % (ref 36.0–46.0)
HEMOGLOBIN: 13.9 g/dL (ref 12.0–15.0)
MCH: 33.9 pg (ref 26.0–34.0)
MCHC: 34.2 g/dL (ref 30.0–36.0)
MCV: 99.3 fL (ref 78.0–100.0)
Platelets: 148 10*3/uL — ABNORMAL LOW (ref 150–400)
RBC: 4.1 MIL/uL (ref 3.87–5.11)
RDW: 13.4 % (ref 11.5–15.5)
WBC: 3 10*3/uL — ABNORMAL LOW (ref 4.0–10.5)

## 2017-09-22 LAB — LIPASE, BLOOD: Lipase: 41 U/L (ref 11–51)

## 2017-09-22 LAB — I-STAT BETA HCG BLOOD, ED (MC, WL, AP ONLY)

## 2017-09-22 MED ORDER — LORAZEPAM 2 MG/ML IJ SOLN
1.0000 mg | Freq: Once | INTRAMUSCULAR | Status: AC
  Administered 2017-09-22: 1 mg via INTRAVENOUS
  Filled 2017-09-22: qty 1

## 2017-09-22 MED ORDER — SODIUM CHLORIDE 0.9 % IV BOLUS
1000.0000 mL | Freq: Once | INTRAVENOUS | Status: AC
  Administered 2017-09-22: 1000 mL via INTRAVENOUS

## 2017-09-22 MED ORDER — ONDANSETRON HCL 4 MG/2ML IJ SOLN
4.0000 mg | Freq: Once | INTRAMUSCULAR | Status: AC | PRN
  Administered 2017-09-22: 4 mg via INTRAVENOUS
  Filled 2017-09-22: qty 2

## 2017-09-22 MED ORDER — ONDANSETRON HCL 4 MG/2ML IJ SOLN
4.0000 mg | Freq: Once | INTRAMUSCULAR | Status: AC
  Administered 2017-09-22: 4 mg via INTRAVENOUS
  Filled 2017-09-22: qty 2

## 2017-09-22 MED ORDER — FAMOTIDINE IN NACL 20-0.9 MG/50ML-% IV SOLN
20.0000 mg | Freq: Once | INTRAVENOUS | Status: AC
  Administered 2017-09-22: 20 mg via INTRAVENOUS
  Filled 2017-09-22: qty 50

## 2017-09-22 NOTE — ED Notes (Signed)
Unsuccessful IV attempt x2.  

## 2017-09-22 NOTE — ED Notes (Signed)
Patient given ginger ale. 

## 2017-09-22 NOTE — ED Notes (Signed)
ED Provider at bedside. 

## 2017-09-22 NOTE — ED Provider Notes (Signed)
[facility]-EMERGENCY DEPT Provider Note   CSN: [id number] Arrival date & time: 09/22/17  1850     History   Chief Complaint Chief Complaint  Patient presents with  . Withdrawal    HPI Alexandra Osborne is a 32 y.o. female.  Patient is a 32 year old female with a history of alcohol abuse who presents with nausea and vomiting.  She states she feels like she is withdrawing from alcohol.  Her last intake was around 10:00 last night.  She has had some ongoing nausea and vomiting throughout the day.  She feels tremulous.  She feels generally fatigued and weak.  She states she is unable to ambulate due to the diffuse weakness.  She denies any hematemesis.  No diarrhea.     Past Medical History:  Diagnosis Date  . Alcoholism /alcohol abuse (HCC)   . Depression   . Substance abuse [facility])     Patient Active Problem List   Diagnosis Date Noted  . Dehydration 01/04/2014  . Nausea and vomiting 01/04/2014  . Metabolic acidosis, increased anion gap 01/04/2014  . Hypokalemia 12/09/2013  . Hypomagnesemia 12/09/2013  . Hyponatremia 12/09/2013  . Malnutrition (HCC) 12/09/2013  . Tobacco abuse 12/09/2013  . Underweight 12/08/2013  . Insomnia 02/04/2011  . ADD (attention deficit disorder with hyperactivity) 02/04/2011  . GAD (generalized anxiety disorder) 02/04/2011  . Alcohol abuse 02/04/2011    History reviewed. No pertinent surgical history.   OB History   None      Home Medications    Prior to Admission medications   Medication Sig Start Date End Date Taking? Authorizing Provider  erythromycin ophthalmic ointment Place 1 application into the left eye 4 (four) times daily. For 7 days Patient not taking: Reported on 09/22/2017 04/15/15   Emi Belfast, FNP  fluticasone [facility]) 50 MCG/ACT nasal spray 1 spray each nostril twice a day Patient not taking: Reported on 09/22/2017 04/09/14   Tonye Pearson, MD  Potassium Chloride ER 20 MEQ TBCR Take 20  mEq by mouth 2 (two) times daily. Patient not taking: Reported on 09/22/2017 04/17/15   Emi Belfast, FNP  temazepam (RESTORIL) 30 MG capsule Take 1 capsule (30 mg total) by mouth at bedtime as needed for sleep. Patient not taking: Reported on 09/22/2017 07/31/15   Morrell Riddle, PA-C    Family History Family History  Problem Relation Age of Onset  . Stroke Maternal Grandfather   . Cancer Maternal Grandmother   . Diabetes Maternal Grandmother   . Stroke Other   . Cancer Other   . Hypertension Other     Social History Social History   Tobacco Use  . Smoking status: Current Some Day Smoker    Types: Cigarettes  . Smokeless tobacco: Never Used  Substance Use Topics  . Alcohol use: Yes    Comment: substance (alcohol) abuse  . Drug use: No     Allergies   Patient has no known allergies.   Review of Systems Review of Systems  Constitutional: Positive for fatigue. Negative for chills, diaphoresis and fever.  HENT: Negative for congestion, rhinorrhea and sneezing.   Eyes: Negative.   Respiratory: Negative for cough, chest tightness and shortness of breath.   Cardiovascular: Negative for chest pain and leg swelling.  Gastrointestinal: Positive for nausea and vomiting. Negative for abdominal pain, blood in stool and diarrhea.  Genitourinary: Negative for difficulty urinating, flank pain, frequency and hematuria.  Musculoskeletal: Negative for arthralgias and back pain.  Skin: Negative  for rash.  Neurological: Positive for tremors. Negative for dizziness, speech difficulty, weakness, numbness and headaches.     Physical Exam Updated Vital Signs BP (!) 124/99   Pulse 77   Resp 16   SpO2 100%   Physical Exam  Constitutional: She is oriented to person, place, and time. She appears well-developed and well-nourished.  HENT:  Head: Normocephalic and atraumatic.  Eyes: Pupils are equal, round, and reactive to light.  Neck: Normal range of motion. Neck supple.    Cardiovascular: Normal rate, regular rhythm and normal heart sounds.  Pulmonary/Chest: Effort normal and breath sounds normal. No respiratory distress. She has no wheezes. She has no rales. She exhibits no tenderness.  Abdominal: Soft. Bowel sounds are normal. There is tenderness (to epigastrium). There is no rebound and no guarding.  Musculoskeletal: Normal range of motion. She exhibits no edema.  Lymphadenopathy:    She has no cervical adenopathy.  Neurological: She is alert and oriented to person, place, and time.  Skin: Skin is warm and dry. No rash noted.  Psychiatric: She has a normal mood and affect.     ED Treatments / Results  Labs (all labs ordered are listed, but only abnormal results are displayed) Labs Reviewed  COMPREHENSIVE METABOLIC PANEL - Abnormal; Notable for the following components:      Result Value   Potassium 3.3 (*)    Glucose, Bld 118 (*)    BUN 5 (*)    AST 95 (*)    ALT 65 (*)    Total Bilirubin 3.0 (*)    Anion gap 16 (*)    All other components within normal limits  CBC - Abnormal; Notable for the following components:   WBC 3.0 (*)    Platelets 148 (*)    All other components within normal limits  URINALYSIS, ROUTINE W REFLEX MICROSCOPIC - Abnormal; Notable for the following components:   Bilirubin Urine SMALL (*)    Protein, ur 30 (*)    Bacteria, UA RARE (*)    All other components within normal limits  LIPASE, BLOOD  I-STAT BETA HCG BLOOD, ED ([facility], [facility], [facility] ONLY)    EKG None  Radiology No results found.  Procedures Procedures (including critical care time)  Medications Ordered in ED Medications  thiamine (VITAMIN B-1) tablet 100 mg (has no administration in time range)  multivitamin with minerals tablet 1 tablet (has no administration in time range)  ondansetron (ZOFRAN-ODT) disintegrating tablet 4 mg (has no administration in time range)  sodium chloride 0.9 % bolus 1,000 mL (1,000 mLs Intravenous New Bag/Given 09/23/17 0031)   ondansetron (ZOFRAN) injection 4 mg (4 mg Intravenous Given 09/22/17 2125)  sodium chloride 0.9 % bolus 1,000 mL (1,000 mLs Intravenous New Bag/Given 09/22/17 2233)  ondansetron (ZOFRAN) injection 4 mg (4 mg Intravenous Given 09/22/17 2229)  LORazepam (ATIVAN) injection 1 mg (1 mg Intravenous Given 09/22/17 2227)  famotidine (PEPCID) IVPB 20 mg premix (0 mg Intravenous Stopped 09/22/17 2303)  chlordiazePOXIDE (LIBRIUM) capsule 50 mg (50 mg Oral Given 09/23/17 0030)  potassium chloride SA (K-DUR,KLOR-CON) CR tablet 40 mEq (40 mEq Oral Given 09/23/17 0029)     Initial Impression / Assessment and Plan / ED Course  I have reviewed the triage vital signs and the nursing notes.  Pertinent labs & imaging results that were available during my care of the patient were reviewed by me and considered in my medical decision making (see chart for details).     PT presents with alcohol withdrawal  symptoms.  Currently she has no nausea or vomiting after treatment in the ED.  She is tolerating oral fluids.  She has a minor tremor.  She has no tachycardia.  No hallucinations.  No seizures.  Her labs show a mild hypokalemia but otherwise unremarkable.  Her anion gap is very minimal.  I feel likely she can be treated as an outpatient but she still feels very weak and does not feel like she can go home yet.  She is getting some more IV fluids and was given a dose of Librium.  She will be reassessed by Dr. Judd Lien following this treatment and if feeling better, can likely be treated as an outpatient.  Her father plans to take her to a rehab facility in [place] on Monday.  Final Clinical Impressions(s) / ED Diagnoses   Final diagnoses:  Alcohol withdrawal syndrome without complication [facility])    ED Discharge Orders    None       Rolan Bucco, MD 09/23/17 [phone]

## 2017-09-22 NOTE — ED Notes (Signed)
Unable to obtain temp at this time due to emesis

## 2017-09-22 NOTE — ED Triage Notes (Signed)
Pt BIB father with complaints of alcohol withdraw. Patient with tremors, nausea and vomiting. Patient has been drinking for 10 years. Patient states she drinks 1-2 large bottles of wine a day. Last drink was yesterday. Patient states N/V began this morning.

## 2017-09-22 NOTE — ED Notes (Signed)
Jari Favrescar RN attempting IV at this time.

## 2017-09-22 NOTE — ED Notes (Signed)
Pt's father states that she would like medication to stop her itching.

## 2017-09-23 MED ORDER — CHLORDIAZEPOXIDE HCL 25 MG PO CAPS: ORAL_CAPSULE | ORAL | 0 refills | Status: AC

## 2017-09-23 MED ORDER — POTASSIUM CHLORIDE CRYS ER 20 MEQ PO TBCR
40.0000 meq | EXTENDED_RELEASE_TABLET | Freq: Once | ORAL | Status: AC
  Administered 2017-09-23: 40 meq via ORAL
  Filled 2017-09-23: qty 2

## 2017-09-23 MED ORDER — ONDANSETRON 4 MG PO TBDP: 4.0000 mg | ORAL_TABLET | Freq: Four times a day (QID) | ORAL | Status: DC | PRN

## 2017-09-23 MED ORDER — ADULT MULTIVITAMIN W/MINERALS CH: 1.0000 | ORAL_TABLET | Freq: Every day | ORAL | Status: DC

## 2017-09-23 MED ORDER — VITAMIN B-1 100 MG PO TABS: 100.0000 mg | ORAL_TABLET | Freq: Every day | ORAL | Status: DC

## 2017-09-23 MED ORDER — CHLORDIAZEPOXIDE HCL 25 MG PO CAPS
50.0000 mg | ORAL_CAPSULE | Freq: Once | ORAL | Status: AC
  Administered 2017-09-23: 50 mg via ORAL
  Filled 2017-09-23: qty 2

## 2017-09-23 MED ORDER — SODIUM CHLORIDE 0.9 % IV BOLUS
1000.0000 mL | Freq: Once | INTRAVENOUS | Status: AC
  Administered 2017-09-23: 1000 mL via INTRAVENOUS

## 2017-09-23 NOTE — Discharge Instructions (Addendum)
Librium taper as prescribed.  Follow-up with your alcohol treatment center as you described to us this evening.

## 2019-03-04 DEATH — deceased

## 2021-11-23 ENCOUNTER — Encounter (HOSPITAL_BASED_OUTPATIENT_CLINIC_OR_DEPARTMENT_OTHER): Payer: Self-pay

## 2021-11-23 DIAGNOSIS — R0683 Snoring: Secondary | ICD-10-CM

## 2021-11-23 DIAGNOSIS — G4700 Insomnia, unspecified: Secondary | ICD-10-CM

## 2021-11-23 DIAGNOSIS — R5383 Other fatigue: Secondary | ICD-10-CM
